# Patient Record
Sex: Female | Born: 1975 | Race: Black or African American | Hispanic: No | Marital: Married | Smoking: Never smoker
Health system: Southern US, Community
[De-identification: ages and names within clinical notes are randomized; demographics above are authoritative.]

## PROBLEM LIST (undated history)

## (undated) ENCOUNTER — Ambulatory Visit: Payer: No Typology Code available for payment source

## (undated) DIAGNOSIS — D649 Anemia, unspecified: Secondary | ICD-10-CM

## (undated) DIAGNOSIS — N809 Endometriosis, unspecified: Secondary | ICD-10-CM

## (undated) DIAGNOSIS — J45909 Unspecified asthma, uncomplicated: Secondary | ICD-10-CM

## (undated) DIAGNOSIS — R112 Nausea with vomiting, unspecified: Secondary | ICD-10-CM

## (undated) DIAGNOSIS — D219 Benign neoplasm of connective and other soft tissue, unspecified: Secondary | ICD-10-CM

## (undated) HISTORY — PX: TUBAL LIGATION: SHX77

## (undated) HISTORY — PX: OTHER SURGICAL HISTORY: SHX169

## (undated) HISTORY — PX: ABDOMINAL HYSTERECTOMY: SHX81

## (undated) MED ORDER — AZITHROMYCIN 250 MG TAB
250 mg | PACK | ORAL | Status: DC
Start: ? — End: 2013-04-12

## (undated) MED ORDER — LEVOFLOXACIN 750 MG TAB
750 mg | ORAL_TABLET | Freq: Every day | ORAL | Status: AC
Start: ? — End: 2012-10-16

## (undated) MED ORDER — BUDESONIDE-FORMOTEROL HFA 160 MCG-4.5 MCG/ACTUATION AEROSOL INHALER: Freq: Two times a day (BID) | RESPIRATORY_TRACT | Status: AC

## (undated) MED ORDER — METFORMIN 500 MG TAB: 500 mg | ORAL_TABLET | Freq: Two times a day (BID) | ORAL | Status: AC

## (undated) MED ORDER — LEVOFLOXACIN 750 MG TAB
750 mg | ORAL_TABLET | Freq: Every day | ORAL | Status: DC
Start: ? — End: 2012-10-09

## (undated) MED ORDER — AZITHROMYCIN 250 MG TAB
250 mg | ORAL_TABLET | ORAL | Status: DC
Start: ? — End: 2013-12-13

## (undated) MED ORDER — IBUPROFEN 800 MG TAB: 800 mg | ORAL_TABLET | Freq: Three times a day (TID) | ORAL | Status: AC | PRN

## (undated) MED ORDER — IRON FUM,PS COMP 125 MG IRON-FOLIC ACID 1 MG-VIT B COMP,C NO.9 CAPSULE
125 mg iron- 1 mg | ORAL_CAPSULE | Freq: Every day | ORAL | Status: DC
Start: ? — End: 2012-12-11

---

## 2012-08-08 LAB — AMB POC COMPLETE CBC,AUTOMATED ENTER
ABS. GRANS (POC): 2.2 10*3/uL (ref 1.4–6.5)
ABS. LYMPHS (POC): 4.3 10*3/uL (ref 1.2–3.4)
ABS. MONOS (POC): 0.5 10*3/uL (ref 0.1–0.6)
GRANULOCYTES (POC): 31.4 % (ref 42.2–75.2)
HCT (POC): 35.3 % (ref 35–60)
HGB (POC): 10.9 g/dL (ref 11–18)
LYMPHOCYTES (POC): 61.7 % (ref 20.5–51.1)
MCH (POC): 26.6 pg (ref 27–31)
MCHC (POC): 31 g/dL (ref 33–37)
MCV (POC): 85.8 fL (ref 80–99.9)
MONOCYTES (POC): 6.9 % (ref 1.7–9.3)
MPV (POC): 8.5 fL (ref 7.8–11)
PLATELET (POC): 275 10*3/uL (ref 150–450)
RBC (POC): 4.11 M/uL (ref 4–6)
RDW (POC): 15.9 % (ref 11.6–13.7)
WBC (POC): 6.9 10*3/uL (ref 4.5–10.5)

## 2012-08-08 LAB — AMB POC URINALYSIS DIP STICK MANUAL W/ MICRO
Bilirubin (UA POC): NEGATIVE
Blood (UA POC): NEGATIVE
Glucose (UA POC): NEGATIVE
Ketones (UA POC): NEGATIVE
Leukocyte esterase (UA POC): NEGATIVE
Nitrites (UA POC): NEGATIVE
Protein (UA POC): NEGATIVE mg/dL
Specific gravity (UA POC): 1.01 (ref 1.001–1.035)
Urobilinogen (UA POC): 0.2 (ref 0.2–1)
pH (UA POC): 6.5 (ref 4.6–8.0)

## 2012-08-08 NOTE — Progress Notes (Signed)
HISTORY OF PRESENT ILLNESS  Brenda James is a 36 y.o. female.  Dizziness   The history is provided by the patient. The current episode started more than 1 week ago. The problem has been gradually worsening. There was no loss of consciousness. Associated symptoms include malaise/fatigue, headaches, dizziness and light-headedness. Pertinent negatives include no visual change, no chest pain, no palpitations, no fever, no abdominal pain, no nausea, no vomiting, no congestion and no back pain. Her past medical history does not include no DM, no HTN, no vertigo, no seizures or no syncope.   This is her first visit to our clinic. Symptoms off and on x 1 year, dizzy, weak, winded, hands shake a lot when she holds them out. Similar to how she felt while pregnant but no nausea. Anemic, takes iron pills but not seeing a difference.   Last labs for anemia 8 months ago, hgb been as low 9, last time was about 10. Has heavy menstrual bleeding - fibroids and endometriosis. Still feels bad even when not on cycle. They last 7 days.   Shakiness is all over, physical exhaustion. Some SOB with exertion, she does exercise 3x per week but has scaled it down. Headache off and on, feels like regular headaches.  Dizziness is not like room spinning.  No syncope.   Night sweats during the summertime, 1-2 x per month. Has had Anemia since birth of her last child in 2006.  Currently on ocp but will be getting implanon next month with OB    Review of Systems   Constitutional: Positive for malaise/fatigue. Negative for fever and chills.   HENT: Negative for ear pain, congestion and sore throat.    Eyes: Negative for blurred vision, pain and redness.   Respiratory: Positive for shortness of breath. Negative for cough.    Cardiovascular: Negative for chest pain and palpitations.   Gastrointestinal: Negative for nausea, vomiting and abdominal pain.   Genitourinary: Negative for dysuria.   Musculoskeletal: Negative for myalgias and back pain.    Skin: Negative for rash.   Neurological: Positive for dizziness, tremors, light-headedness and headaches. Negative for syncope.   Psychiatric/Behavioral: Negative for depression.     BP 130/80   Pulse 77   Temp(Src) 98.2 ??F (36.8 ??C) (Oral)   Ht 5' 9.5" (1.765 m)   Wt 263 lb (119.296 kg)   BMI 38.29 kg/m2     Physical Exam   Nursing note and vitals reviewed.  Constitutional: She is oriented to person, place, and time. She appears well-developed and well-nourished. No distress.   HENT:   Head: Normocephalic and atraumatic.   Neck: No JVD present. No thyromegaly present.   Cardiovascular: Normal rate, regular rhythm, normal heart sounds and intact distal pulses.  Exam reveals no gallop and no friction rub.    No murmur heard.  Pulmonary/Chest: Effort normal and breath sounds normal. She has no wheezes.   Neurological: She is alert and oriented to person, place, and time.   Skin: Skin is warm and dry.       ASSESSMENT and PLAN  1. Fatigue  AMB POC COMPLETE CBC,AUTOMATED ENTER, AMB POC URINALYSIS DIP STICK MANUAL W/ MICRO, METABOLIC PANEL, COMPREHENSIVE, HEMOGLOBIN A1C, TSH, 3RD GENERATION, FERRITIN, IRON    check labs to rule out possible underlying causes. she does have h/o anemia so this could be a cause. Arrange f/u once labs are back.   2. Dizziness  AMB POC COMPLETE CBC,AUTOMATED ENTER, AMB POC URINALYSIS DIP STICK MANUAL W/ MICRO, METABOLIC  PANEL, COMPREHENSIVE, HEMOGLOBIN A1C, TSH, 3RD GENERATION, FERRITIN, IRON

## 2012-08-09 LAB — METABOLIC PANEL, COMPREHENSIVE
A-G Ratio: 1.1 (ref 1.1–2.5)
ALT (SGPT): 14 IU/L (ref 0–32)
AST (SGOT): 20 IU/L (ref 0–40)
Albumin: 3.8 g/dL (ref 3.5–5.5)
Alk. phosphatase: 79 IU/L (ref 39–117)
BUN/Creatinine ratio: 12 (ref 8–20)
BUN: 7 mg/dL (ref 6–20)
Bilirubin, total: 0.2 mg/dL (ref 0.0–1.2)
CO2: 21 mmol/L (ref 19–28)
Calcium: 9 mg/dL (ref 8.7–10.2)
Chloride: 101 mmol/L (ref 97–108)
Creatinine: 0.6 mg/dL (ref 0.57–1.00)
GFR est AA: 135 mL/min/{1.73_m2} (ref >59)
GFR est non-AA: 117 mL/min/{1.73_m2} (ref >59)
GLOBULIN, TOTAL: 3.4 g/dL (ref 1.5–4.5)
Glucose: 85 mg/dL (ref 65–99)
Potassium: 4.4 mmol/L (ref 3.5–5.2)
Protein, total: 7.2 g/dL (ref 6.0–8.5)
Sodium: 136 mmol/L (ref 134–144)

## 2012-08-09 LAB — IRON: Iron: 21 ug/dL — ABNORMAL LOW (ref 35–155)

## 2012-08-09 LAB — HEMOGLOBIN A1C WITH EAG: Hemoglobin A1c: 6 % — ABNORMAL HIGH (ref 4.8–5.6)

## 2012-08-09 LAB — TSH 3RD GENERATION: TSH: 0.543 u[IU]/mL (ref 0.450–4.500)

## 2012-08-09 LAB — FERRITIN: Ferritin: 9 ng/mL — ABNORMAL LOW (ref 15–150)

## 2012-08-09 NOTE — Progress Notes (Signed)
Quick Note:    Her labs do show anemia - Hgb 10.9 but her iron levels are very low. I will send in rx iron supplement and recheck in 3 months. Also her labs did show she is pre-diabetic which could explain some of her symptoms - i would like to see her back to discuss treatment options. Otherwise labs normal.  ______

## 2012-08-09 NOTE — Telephone Encounter (Signed)
Patient notified of information. appt 11/12.

## 2012-08-09 NOTE — Telephone Encounter (Signed)
Message copied by Deatra Ina on Thu Aug 09, 2012  1:54 PM  ------       Message from: Charlsie Merles CASTON       Created: Thu Aug 09, 2012  1:29 PM         Her labs do show anemia - Hgb 10.9 but her iron levels are very low. I will send in rx iron supplement and recheck in 3 months. Also her labs did show she is pre-diabetic which could explain some of her symptoms - i would like to see her back to discuss treatment options. Otherwise labs normal.  ------

## 2012-08-09 NOTE — Telephone Encounter (Signed)
Left message for patient to return our call.

## 2012-08-14 NOTE — Progress Notes (Signed)
HISTORY OF PRESENT ILLNESS  Brenda James is a 36 y.o. female.  HPI  The patient is a 36 y.o. female who is seen for follow up of prediabetes, anemia.  Symptoms are stable.  The patient is following treatment regimen with good compliance.  She  is following an appropriate diet and is exercising regularly.   She had some abnormal labs at her last appt - here to discuss them. She has Been on iron x 3 days and feels a difference already.   She has strong FH of diabetes and is concerned about her pre-diabetes diagnosis. She has been working out at gym 3x a week, not seeing results or losing weight. She doesn't eat a lot of sweets, but does like pasta. Found out her paternal uncle and GF and Dad had diabetes.    Review of Systems   Constitutional: Negative for fever, chills and malaise/fatigue.   HENT: Negative for ear pain, congestion and sore throat.    Eyes: Negative for blurred vision, pain and redness.   Respiratory: Negative for cough and shortness of breath.    Cardiovascular: Negative for chest pain and palpitations.   Gastrointestinal: Negative for nausea, vomiting and abdominal pain.   Genitourinary: Negative for dysuria.   Musculoskeletal: Negative for myalgias and back pain.   Skin: Negative for rash.   Neurological: Negative for dizziness and headaches.   Psychiatric/Behavioral: Negative for depression.     BP 110/90   Pulse 68   Temp(Src) 98.6 ??F (37 ??C) (Oral)   Ht 5' 9.5" (1.765 m)   Wt 263 lb (119.296 kg)   BMI 38.29 kg/m2     Physical Exam   Constitutional: She is oriented to person, place, and time. She appears well-developed and well-nourished. She is cooperative. No distress.   HENT:   Head: Normocephalic and atraumatic.   Pulmonary/Chest: Effort normal. No respiratory distress.   Neurological: She is alert and oriented to person, place, and time. Coordination and gait normal.   Skin: No rash noted. She is not diaphoretic. No pallor.   Psychiatric: She has a normal mood and affect. Her speech is  normal and behavior is normal. Judgment and thought content normal. Her mood appears not anxious. Cognition and memory are normal. She does not exhibit a depressed mood.       ASSESSMENT and PLAN  1. Prediabetes  metFORMIN (GLUCOPHAGE) 500 mg tablet   2. Iron deficiency anemia, unspecified      doing well on iron supplement, recheck CBC and iron levels in 3 months   Went over diagnosis, pathophysiology, and treatment options. Start Metformin and diabetic diet. Handouts provided. RTC 3 months to recheck A1C.  15 minutes spent counseling patient today.

## 2012-08-29 NOTE — Progress Notes (Signed)
HISTORY OF PRESENT ILLNESS  Brenda James is a 36 y.o. female.  HPI  The patient is a 36 y.o. female who is seen for evaluation of productive cough. Symptoms began 2 weeks ago and are gradually worsening since that time.  Treatment to date: decongestants, cough suppressants.  Started as runny nose 2 weeks ago. Now has dry cough, every now and then is moist. Feels rattling and wheezing in chest x 1.5 weeks. Taking OTC meds and not getting better.   Had aches initially, but then that passed. Cough has stayed. Coughs up mucus first thing in the am. Taking theraflu, alka seltzer cold, mucinex. mucinex helped some.   Sore throat went away. Does feel short of breath.      Review of Systems   Constitutional: Negative for fever, chills and malaise/fatigue.   HENT: Positive for sore throat. Negative for ear pain and congestion.    Eyes: Negative for blurred vision, pain and redness.   Respiratory: Positive for cough, sputum production and wheezing. Negative for shortness of breath.    Cardiovascular: Negative for chest pain and palpitations.   Gastrointestinal: Negative for nausea, vomiting and abdominal pain.   Genitourinary: Negative for dysuria.   Musculoskeletal: Negative for myalgias and back pain.   Skin: Negative for rash.   Neurological: Positive for headaches. Negative for dizziness.   Psychiatric/Behavioral: Negative for depression.     BP 108/90   Pulse 79   Temp(Src) 98.4 ??F (36.9 ??C) (Oral)   Ht 5' 9.5" (1.765 m)   Wt 255 lb (115.667 kg)   BMI 37.13 kg/m2     Physical Exam   Nursing note and vitals reviewed.  Constitutional: She appears well-developed and well-nourished. No distress.   HENT:   Head: Normocephalic and atraumatic.   Right Ear: Hearing, tympanic membrane, external ear and ear canal normal.   Left Ear: Hearing, tympanic membrane, external ear and ear canal normal.   Nose: Mucosal edema and rhinorrhea present. No sinus tenderness.   Mouth/Throat: Posterior oropharyngeal erythema present.    Cardiovascular: Normal rate, regular rhythm and normal heart sounds.    Pulmonary/Chest: Effort normal and breath sounds normal. No respiratory distress. She has no wheezes. She has no rhonchi. She has no rales.   Lymphadenopathy:     She has cervical adenopathy.       ASSESSMENT and PLAN  1. Acute bronchitis  azithromycin (ZITHROMAX Z-PAK) 250 mg tablet   Treat with antibiotics, RTC if not improving

## 2012-09-10 NOTE — Telephone Encounter (Signed)
Patient states she is still having some coughing and wheezing. Should she just wait a few days to let the antibiotic work or RTC?

## 2012-09-10 NOTE — Telephone Encounter (Signed)
i sent rx for short course of prednisone to pharmacy - sounds like she is still having airway spasm. This should help with that. RTC if not improved after that

## 2012-09-10 NOTE — Telephone Encounter (Signed)
Patient notified of information.

## 2012-10-09 NOTE — Progress Notes (Signed)
HISTORY OF PRESENT ILLNESS  Brenda James is a 37 y.o. female.  HPI  HISTORY OF PRESENT ILLNESS  Brenda James is a 37 y.o. female.  HPI  The patient is a 37 y.o. female who is seen for evaluation of productive cough. Symptoms began 2 weeks ago and are gradually worsening since that time.  Treatment to date: decongestants, cough suppressants.  Started as runny nose 2 weeks ago. Now has dry cough, every now and then is moist. Feels rattling and wheezing in chest x 1.5 weeks. Taking OTC meds and not getting better.   Had aches initially, but then that passed. Cough has stayed. Coughs up mucus first thing in the am. Taking theraflu, alka seltzer cold, mucinex. mucinex helped some.   Sore throat went away. Does feel short of breath.  UPDATE 10/09/12: Same as last time but to a lesser degree. Still coughing and having some spasms. Dry hacking cough. Got better on antibiotics but cough was still there, nonproductive. Just started back up within last 2 weeks and getting worse again. While on elliptical the other day while exercising she felt SOB which has hever happened before. Symptoms worse at night. prednisone helped a little bit but not too much. No fevers and feels OK otherwise. Did have bad episode of bronchitis last year, had to use inhaler.    Review of Systems   Constitutional: Negative for fever, chills and malaise/fatigue.   HENT: Negative for ear pain and congestion, sore throat  Eyes: Negative for blurred vision, pain and redness.   Respiratory: Positive for cough, sputum production and wheezing. Negative for shortness of breath.    Cardiovascular: Negative for chest pain and palpitations.   Gastrointestinal: Negative for nausea, vomiting and abdominal pain.   Genitourinary: Negative for dysuria.   Musculoskeletal: Negative for myalgias and back pain.   Skin: Negative for rash.   Neurological: Positive for headaches. Negative for dizziness.   Psychiatric/Behavioral: Negative for depression.     BP 122/72    Pulse 89   Temp(Src) 98.2 ??F (36.8 ??C) (Oral)   Ht 5' 9.5" (1.765 m)   Wt 258 lb (117.028 kg)   BMI 37.57 kg/m2     Physical Exam   Nursing note and vitals reviewed.  Constitutional: She appears well-developed and well-nourished. No distress.   HENT:   Head: Normocephalic and atraumatic.   Right Ear: Hearing, tympanic membrane, external ear and ear canal normal.   Left Ear: Hearing, tympanic membrane, external ear and ear canal normal.   Nose: Mucosal edema and rhinorrhea present. No sinus tenderness.   Mouth/Throat: Posterior oropharyngeal erythema present.   Cardiovascular: Normal rate, regular rhythm and normal heart sounds.    Pulmonary/Chest: Effort normal and breath sounds normal. No respiratory distress. She has no wheezes. She has no rhonchi. She has no rales.   Lymphadenopathy:     She has cervical adenopathy.       ROS    Physical Exam    ASSESSMENT and PLAN  1. Acute bronchitis  levofloxacin (LEVAQUIN) 750 mg tablet, budesonide-formoterol (SYMBICORT) 160-4.5 mcg/actuation HFA inhaler, DISCONTINUED: levofloxacin (LEVAQUIN) 750 mg tablet    not resolving, tx with levaquin. add symbicort inhaler BID for 1-2 weeks. RTC if not resolving and will do cxr and labs

## 2012-10-31 NOTE — Progress Notes (Signed)
HISTORY OF PRESENT ILLNESS  Brenda James is a 37 y.o. female.  Numbness  The history is provided by the patient. This is a new problem. The current episode started more than 1 week ago. The problem has not changed since onset.There was right lower extremity focality noted. Primary symptoms include loss of sensation.Pertinent negatives include no loss of balance. There has been no fever. Pertinent negatives include no shortness of breath, no chest pain, no vomiting, no headaches and no nausea. Associated medical issues include trauma.   Right pinky toe numb x 3.5 weeks and under the ball of the foot on that side. Constant. Is on her feet most of the day at new job but is wearing comfortable shoes (tennis shoes). Nothing in the other foot. At first felt like foot was asleep but now just feels numb. Can't feel pain (has tested it with pin prick at home). She is on metformin x 2-3 months now. Worries it could be from the prediabetes. No trauma ot the foot. Does not run.    Review of Systems   Constitutional: Negative for fever, chills and malaise/fatigue.   HENT: Negative for ear pain, congestion and sore throat.    Eyes: Negative for blurred vision, pain and redness.   Respiratory: Negative for cough and shortness of breath.    Cardiovascular: Negative for chest pain and palpitations.   Gastrointestinal: Negative for nausea, vomiting and abdominal pain.   Genitourinary: Negative for dysuria.   Musculoskeletal: Negative for myalgias and back pain.   Skin: Negative for rash.   Neurological: Positive for tingling and numbness. Negative for dizziness, headaches and loss of balance.   Psychiatric/Behavioral: Negative for depression.     BP 110/70   Pulse 98   Temp(Src) 98.4 ??F (36.9 ??C) (Oral)   Ht 5' 9.5" (1.765 m)   Wt 256 lb (116.121 kg)   BMI 37.28 kg/m2     Physical Exam   Constitutional: She is oriented to person, place, and time. She appears well-developed and well-nourished. She is cooperative. No distress.    HENT:   Head: Normocephalic and atraumatic.   Pulmonary/Chest: Effort normal. No respiratory distress.   Musculoskeletal:        Right ankle: She exhibits no swelling, no ecchymosis, no deformity and normal pulse. No tenderness.   Neurological: She is alert and oriented to person, place, and time. She has normal strength. A sensory deficit is present. No cranial nerve deficit. Coordination and gait normal.   Decreased sensation to light touch, pressure and pain over right 5th toe and lateral edge of foot from toes to arch (plantar surface)   Skin: No rash noted. She is not diaphoretic. No pallor.   Psychiatric: She has a normal mood and affect. Her speech is normal and behavior is normal. Judgment and thought content normal. Her mood appears not anxious. Cognition and memory are normal. She does not exhibit a depressed mood.       ASSESSMENT and PLAN  1. Numbness     if still present in 3-4 weeks call back and will send to neurology for nerve conduction studies   no abnormality seen on exam today - differential includes superficial nerve compression from new shoewear/being on feet all day, peripheral neuropathy, or back/nerve issue. Call back sooner if worsening. Advised her to try switching to different pair of shoes, rest/elevation of foot when not working.

## 2012-12-11 LAB — AMB POC COMPLETE CBC,AUTOMATED ENTER
ABS. GRANS (POC): 2.7 10*3/uL (ref 1.4–6.5)
ABS. LYMPHS (POC): 2.9 10*3/uL (ref 1.2–3.4)
ABS. MONOS (POC): 0.4 10*3/uL (ref 0.1–0.6)
GRANULOCYTES (POC): 45 % (ref 42.2–75.2)
HCT (POC): 37.4 % (ref 35–60)
HGB (POC): 12.3 g/dL (ref 11–18)
LYMPHOCYTES (POC): 48.8 % (ref 20.5–51.1)
MCH (POC): 28.8 pg (ref 27–31)
MCHC (POC): 32.9 g/dL — AB (ref 33–37)
MCV (POC): 87.6 fL (ref 80–99.9)
MONOCYTES (POC): 6.2 % (ref 1.7–9.3)
MPV (POC): 7.8 fL (ref 7.8–11)
PLATELET (POC): 258 10*3/uL (ref 150–450)
RBC (POC): 4.27 M/uL (ref 4–6)
RDW (POC): 14.2 % — AB (ref 11.6–13.7)
WBC (POC): 5.9 10*3/uL (ref 4.5–10.5)

## 2012-12-11 NOTE — Progress Notes (Signed)
HISTORY OF PRESENT ILLNESS  Brenda James is a 37 y.o. female.  HPI  The patient is a 37 y.o. female who is seen for follow up of anemia, prediabetes.  Symptoms are stable.  The patient is following treatment regimen with good compliance.  She  is following an appropriate diet and is exercising regularly.   Had some Diarrhea from time to time but otherwise doing well on metformin.  Tried to give blood and they told her it was too low (was 1 month ago). She says has been taking iron supplement daily but the nexplanon just kicked in - had period from December until 2 weeks ago. She thinks this might be why level hadn't improved yet.    Review of Systems   Constitutional: Negative for fever, chills and malaise/fatigue.   HENT: Negative for ear pain, congestion and sore throat.    Eyes: Negative for blurred vision, pain and redness.   Respiratory: Negative for cough and shortness of breath.    Cardiovascular: Negative for chest pain and palpitations.   Gastrointestinal: Negative for nausea, vomiting and abdominal pain.   Genitourinary: Negative for dysuria.   Musculoskeletal: Negative for myalgias and back pain.   Skin: Negative for rash.   Neurological: Negative for dizziness and headaches.   Psychiatric/Behavioral: Negative for depression.     BP 110/76   Pulse 100   Temp(Src) 98.4 ??F (36.9 ??C) (Oral)   Ht 5' 9.5" (1.765 m)   Wt 255 lb (115.667 kg)   BMI 37.13 kg/m2     Physical Exam   Constitutional: She is oriented to person, place, and time. She appears well-developed and well-nourished. She is cooperative. No distress.   HENT:   Head: Normocephalic and atraumatic.   Pulmonary/Chest: Effort normal. No respiratory distress.   Neurological: She is alert and oriented to person, place, and time. Coordination and gait normal.   Skin: No rash noted. She is not diaphoretic. No pallor.   Psychiatric: She has a normal mood and affect. Her speech is normal and behavior is normal. Judgment and thought content normal. Her  mood appears not anxious. Cognition and memory are normal. She does not exhibit a depressed mood.       ASSESSMENT and PLAN  1. Anemia  AMB POC COMPLETE CBC,AUTOMATED ENTER, Iron Fum & P-FA-Vit B & C No.9 (INTEGRA PLUS) 125-1 mg cap    still taking iron supplements, recheck CBC. if not improving is likely due to uncontrolled menorrhagia which just stopped with nexplanon.    2. Prediabetes  HEMOGLOBIN A1C, metFORMIN (GLUCOPHAGE) 500 mg tablet    recheck A1C after starting metformin, doing well on it.   15 minutes spent counseling patient today.

## 2012-12-12 LAB — HEMOGLOBIN A1C WITH EAG: Hemoglobin A1c: 5.4 % (ref 4.8–5.6)

## 2012-12-13 NOTE — Telephone Encounter (Signed)
Message copied by Deatra Ina on Thu Dec 13, 2012 12:02 PM  ------       Message from: Charlsie Merles CASTON       Created: Thu Dec 13, 2012  6:25 AM         Her A1C went down from 6.0 to 5.4 which is great! This is now in the normal range - cont the metformin and recheck in 6 mos. Also her Hgb has improved from 10.9 to 12.3 which is normal - hopefully now that menstrual cycle is controlled this will stay stable.  ------

## 2012-12-13 NOTE — Telephone Encounter (Signed)
Left message for patient to return our call.

## 2012-12-18 NOTE — Telephone Encounter (Signed)
Left message for patient to return our call.

## 2012-12-18 NOTE — Telephone Encounter (Signed)
Patient notified of information.

## 2013-01-01 NOTE — Progress Notes (Signed)
HISTORY OF PRESENT ILLNESS  Brenda James is a 37 y.o. female.  HPI c/o severe pain right 5th toe and distal foot s/p fall on stairs last pm. + swelling.    Review of Systems   Musculoskeletal: Positive for joint pain (and swelling).       Physical Exam   Constitutional: She appears well-developed. No distress.   Musculoskeletal:   Right 5th toe swollen; decreased ROM; TTP  Distal 5th MT and MTP joint TTP   Neurological: A sensory deficit (5th toe decreased FT) is present.   Skin: Skin is intact.   Blood pressure 120/74, pulse 86, temperature 98.5 ??F (36.9 ??C), temperature source Oral, height 5' 9.5" (1.765 m), weight 254 lb (115.214 kg), last menstrual period 12/28/2012.      ASSESSMENT and PLAN    ICD-9-CM    1. Toe sprain 845.10 ibuprofen (MOTRIN) 800 mg tablet    fx shoe prn; RICE   2. Foot pain 729.5 XR FOOT RT MIN 3 V     XR negative  F/u prn

## 2013-01-01 NOTE — Patient Instructions (Signed)
Rest, cold compresses, compression wrap and elevation of involved joint recommended.

## 2013-03-01 NOTE — Progress Notes (Signed)
HISTORY OF PRESENT ILLNESS  Brenda James is a 37 y.o. female.  HPI c/o 4 week h/o NP cough w/ occasional paroxysms. Constant day and night. No relief w/ OTC cough meds. Does have mild postnasal drip.    Review of Systems   Constitutional: Negative for fever.   HENT: Negative for congestion.    Respiratory: Positive for cough. Negative for shortness of breath.    Gastrointestinal: Negative for heartburn (oral regurgitation ).       Physical Exam   Constitutional: She appears well-developed. No distress.   HENT:   Nose: Nose normal.   Mouth/Throat: Oropharynx is clear and moist.   Cardiovascular: Normal rate, regular rhythm and normal heart sounds.    No murmur heard.  Pulmonary/Chest: Effort normal and breath sounds normal.   Psychiatric: She has a normal mood and affect.   Blood pressure 110/78, pulse 70, height 5' 9.5" (1.765 m), weight 260 lb (117.935 kg), SpO2 98.00%.      ASSESSMENT and PLAN    ICD-9-CM    1. Cough 786.2 budesonide-formoterol (SYMBICORT) 160-4.5 mcg/actuation HFA inhaler     azithromycin (ZITHROMAX Z-PAK) 250 mg tablet     benzonatate (TESSALON) 200 mg capsule       Allergic PND vs pertussis - will tx as above + zyrtec 10 mg qd

## 2013-04-12 NOTE — Progress Notes (Signed)
HISTORY OF PRESENT ILLNESS  Brenda James is a 37 y.o. female. She hurt her right ankle. Last night she was walking on wet grass and was wearing flip flops. She fell  With her right leg bent behind her with her right foot everted.She heard a  loud snap. Can not bear weight. Has to limp. Husband had to help her home last night. Today it is swollen and very painful.    HPI    Review of Systems   Constitutional: Negative for fever, chills, weight loss and malaise/fatigue.   HENT: Negative for hearing loss.    Cardiovascular: Positive for palpitations. Negative for chest pain.   Gastrointestinal: Negative for nausea and vomiting.   Genitourinary: Negative for dysuria.   Musculoskeletal: Positive for joint pain (right ankle).   Neurological: Negative for dizziness, tingling and headaches.       Physical Exam   Nursing note and vitals reviewed.  Constitutional: She is oriented to person, place, and time. She appears well-developed and well-nourished.   HENT:   Head: Normocephalic.   Cardiovascular: Normal rate and regular rhythm.    Pulmonary/Chest: Effort normal and breath sounds normal.   Musculoskeletal: She exhibits edema (right lateral ankle tenderness strenght intact and no limitation in range of mottion) and tenderness.   Neurological: She is alert and oriented to person, place, and time.   Skin: Skin is warm and dry.   Psychiatric: She has a normal mood and affect. Her behavior is normal.   Sitting in wheelchair    ASSESSMENT and PLAN    ICD-9-CM    1. Ankle pain, right 719.47 XR ANKLE RT MIN 3 V     PR LT COMPRES BAND >=3 <5/YD     ANKLE CONTROL ORTHOSI PREFAB     CRUTCH UNDERARM PAIR WOOD   No fracture. Ice elevated crutches ace wrap and air cast. Follow up on tuesday of next week. Ankle strengthening  exercises when not weight bearing . Crutches for 3 days only. COntinue ibuprofen for pain.

## 2013-08-20 NOTE — Progress Notes (Signed)
HISTORY OF PRESENT ILLNESS  Brenda James is a 37 y.o. female.  HPI  The patient is a 37 y.o. female who is seen for evaluation of productive cough and sore throat. Symptoms began 5 days ago and are gradually worsening since that time.  Treatment to date: none.  Started Friday in chest  - now with some sputum. Now having sore throat, really bad w/ cough. No fevers/chills. Slight body aches. Slight nasal congestion. Worse. No headaches.   Has gotten bronchitis a few times a year in the past.     Review of Systems   Constitutional: Negative for fever, chills and malaise/fatigue.   HENT: Positive for sore throat. Negative for ear pain and congestion.    Eyes: Negative for blurred vision, pain and redness.   Respiratory: Positive for cough, sputum production and wheezing. Negative for shortness of breath.    Cardiovascular: Negative for chest pain and palpitations.   Gastrointestinal: Negative for nausea, vomiting and abdominal pain.   Genitourinary: Negative for dysuria.   Musculoskeletal: Negative for myalgias and back pain.   Skin: Negative for rash.   Neurological: Negative for dizziness and headaches.   Psychiatric/Behavioral: Negative for depression.     BP 130/90   Pulse 116   Temp(Src) 98.9 ??F (37.2 ??C) (Oral)   Ht 5' 9.5" (1.765 m)   Wt 260 lb (117.935 kg)   BMI 37.86 kg/m2   SpO2 97%     Physical Exam   Nursing note and vitals reviewed.  Constitutional: She appears well-developed and well-nourished. No distress.   HENT:   Head: Normocephalic and atraumatic.   Right Ear: Hearing, tympanic membrane, external ear and ear canal normal.   Left Ear: Hearing, tympanic membrane, external ear and ear canal normal.   Nose: Mucosal edema and rhinorrhea present. No sinus tenderness.   Mouth/Throat: Posterior oropharyngeal erythema present.   Cardiovascular: Normal rate, regular rhythm and normal heart sounds.    Pulmonary/Chest: Effort normal and breath sounds normal. No respiratory distress. She has no wheezes. She has  no rhonchi. She has no rales.   Tight wheezy cough   Lymphadenopathy:     She has cervical adenopathy.       ASSESSMENT and PLAN    ICD-9-CM    1. Acute bronchitis 466.0 azithromycin (ZITHROMAX Z-PAK) 250 mg tablet     budesonide-formoterol (SYMBICORT) 160-4.5 mcg/actuation HFA inhaler    tx with zpack and symbicort for acute use. Discussed she may have recurrent bronchitis due to asthma.   2. Flu vaccine need V04.81 INFLUENZA VACCINE QUADRIVALENT 3 YRS PLUS IM     PR IMMUNIZ ADMIN,1 SINGLE/COMB VAC/TOXOID

## 2013-12-13 LAB — AMB POC URINALYSIS DIP STICK MANUAL W/ MICRO
Bilirubin (UA POC): NEGATIVE
Glucose (UA POC): NEGATIVE
Ketones (UA POC): NEGATIVE
Leukocyte esterase (UA POC): NEGATIVE
Nitrites (UA POC): NEGATIVE
Protein (UA POC): NEGATIVE mg/dL
Specific gravity (UA POC): 1.02 (ref 1.001–1.035)
Urobilinogen (UA POC): 0.2 (ref 0.2–1)
pH (UA POC): 7 (ref 4.6–8.0)

## 2013-12-13 MED ORDER — CIPROFLOXACIN 500 MG TAB
500 mg | ORAL_TABLET | Freq: Two times a day (BID) | ORAL | Status: DC
Start: 2013-12-13 — End: 2014-02-06

## 2013-12-13 NOTE — Progress Notes (Signed)
HISTORY OF PRESENT ILLNESS  Brenda James is a 38 y.o. female.  HPI   The patient is a 38 y.o. female who is seen for evaluation of frequency.  She has had symptoms for 2 weeks. Patient also complains of see below. Patient denies fever. Patient does not have a history of recurrent UTI.  Patient does not have a history of pyelonephritis.   Urgency, cloudy, worse in morning, strong odor. Been on for a few weeks. Getting worse. Just started nuvaring 3 mos ago. No back pain/fever/chills. Denies any vaginal discharge or discomfort      Review of Systems   Constitutional: Negative for fever, chills and malaise/fatigue.   HENT: Negative for congestion, ear pain and sore throat.    Eyes: Negative for blurred vision, pain and redness.   Respiratory: Negative for cough and shortness of breath.    Cardiovascular: Negative for chest pain and palpitations.   Gastrointestinal: Negative for nausea, vomiting and abdominal pain.   Genitourinary: Positive for frequency. Negative for dysuria.   Musculoskeletal: Negative for myalgias and back pain.   Skin: Negative for rash.   Neurological: Negative for dizziness and headaches.   Psychiatric/Behavioral: Negative for depression.     BP 122/78    Pulse 117    Temp(Src) 98.4 ??F (36.9 ??C) (Oral)    Ht 5' 9.5" (1.765 m)    Wt 272 lb (123.378 kg)    BMI 39.60 kg/m2      SpO2 97%      Physical Exam   Constitutional: She is oriented to person, place, and time. She appears well-developed and well-nourished. No distress.   HENT:   Head: Normocephalic and atraumatic.   Neck: Neck supple.   Cardiovascular: Normal rate, regular rhythm and normal heart sounds.  Exam reveals no gallop and no friction rub.    No murmur heard.  Pulmonary/Chest: Effort normal and breath sounds normal.   Abdominal: Soft. Bowel sounds are normal. There is no hepatosplenomegaly. There is tenderness in the suprapubic area. No hernia.   Neurological: She is alert and oriented to person, place, and time.   Skin: Skin is  warm and dry.   Nursing note and vitals reviewed.      ASSESSMENT and PLAN    ICD-9-CM    1. UTI (lower urinary tract infection) 599.0     tx with cipro, RTC if not improving   2. Urinary hesitancy 788.64 AMB POC URINALYSIS DIP STICK MANUAL W/ MICRO     ciprofloxacin HCl (CIPRO) 500 mg tablet

## 2014-02-06 MED ORDER — VALACYCLOVIR 1 G TAB
1 gram | ORAL_TABLET | Freq: Two times a day (BID) | ORAL | Status: AC
Start: 2014-02-06 — End: 2014-02-07

## 2014-02-06 MED ORDER — AZITHROMYCIN 250 MG TAB
250 mg | ORAL_TABLET | ORAL | Status: AC
Start: 2014-02-06 — End: ?

## 2014-02-06 MED ORDER — ALBUTEROL SULFATE HFA 90 MCG/ACTUATION AEROSOL INHALER
90 mcg/actuation | RESPIRATORY_TRACT | Status: AC | PRN
Start: 2014-02-06 — End: ?

## 2014-02-06 NOTE — Progress Notes (Signed)
HISTORY OF PRESENT ILLNESS  Brenda James is a 38 y.o. female.  HPI   The patient is a 38 y.o. female who is seen for evaluation of nonproductive cough and wheezing. Symptoms began 2 weeks ago and are gradually worsening since that time.  Treatment to date: decongestants, cough suppressants.  2-3 weeks ago started w/ bad breakout of hives. Then got cold sx, sore throat, nasal congestion, wheezy, coughing some sputum initially. Sore throat is better but still has cough, wheezing, congestion in chest. No fevers, some slight body aches. Tried theraflu, mucinex - helped some.   Still has symbicort - started using it again when these symptoms started - takes it at night b/c that is when she has more trouble w/ breathing. Brother has asthma. Pt never been diagnosed w/ asthma but we have seen her a few times for recurrent bronchitis.   Says she hasn't had any allergy symptoms, doesn't usually have issues w/ that.    Would like something for cold sores prn - usually gets it 5-6 times per year. More in past 3 weeks.    Review of Systems   Constitutional: Positive for malaise/fatigue. Negative for fever and chills.   HENT: Positive for sore throat. Negative for congestion and ear pain.    Eyes: Negative for blurred vision, pain and redness.   Respiratory: Positive for cough and wheezing. Negative for shortness of breath.    Cardiovascular: Negative for chest pain and palpitations.   Gastrointestinal: Negative for nausea, vomiting and abdominal pain.   Genitourinary: Negative for dysuria.   Musculoskeletal: Positive for myalgias. Negative for back pain.   Skin: Negative for rash.   Neurological: Negative for dizziness and headaches.   Psychiatric/Behavioral: Negative for depression.     BP 134/79   Pulse 103   Temp(Src) 98.1 ??F (36.7 ??C) (Oral)   Ht 5' 9.5" (1.765 m)   Wt 283 lb (128.368 kg)   BMI 41.21 kg/m2   SpO2 99%     Physical Exam   Constitutional: She appears well-developed and well-nourished. No distress.   HENT:    Head: Normocephalic and atraumatic.   Right Ear: Hearing, tympanic membrane, external ear and ear canal normal.   Left Ear: Hearing, tympanic membrane, external ear and ear canal normal.   Nose: Mucosal edema and rhinorrhea present. No sinus tenderness.   Mouth/Throat: Posterior oropharyngeal erythema present.   Cardiovascular: Normal rate, regular rhythm and normal heart sounds.    Pulmonary/Chest: Effort normal and breath sounds normal. No respiratory distress. She has no wheezes. She has no rhonchi. She has no rales.   Lymphadenopathy:     She has cervical adenopathy.   Nursing note and vitals reviewed.      ASSESSMENT and PLAN    ICD-9-CM    1. Bronchitis 490 azithromycin (ZITHROMAX Z-PAK) 250 mg tablet     albuterol (PROVENTIL HFA, VENTOLIN HFA, PROAIR HFA) 90 mcg/actuation inhaler    tx with zpack, cont symbicort daily until infection resolves. prn albuterol inhaler to use for SOB or cough/wheeze. RTC 1 mos to do PFTs as may have asthma   2. Cold sore 054.9 valACYclovir (VALTREX) 1 gram tablet    valtrex to use prn cold sores

## 2014-08-20 ENCOUNTER — Other Ambulatory Visit (HOSPITAL_COMMUNITY)
Admission: RE | Admit: 2014-08-20 | Discharge: 2014-08-20 | Disposition: A | Discharge status: Home / Self Care | Payer: Self-pay | Source: Ambulatory Visit | Attending: Obstetrics and Gynecology | Admitting: Obstetrics and Gynecology

## 2014-08-20 DIAGNOSIS — Z1151 Encounter for screening for human papillomavirus (HPV): Secondary | ICD-10-CM | POA: Insufficient documentation

## 2014-08-20 DIAGNOSIS — Z01419 Encounter for gynecological examination (general) (routine) without abnormal findings: Secondary | ICD-10-CM | POA: Insufficient documentation

## 2014-09-04 ENCOUNTER — Encounter: Payer: Self-pay | Admitting: Internal Medicine

## 2014-09-15 ENCOUNTER — Ambulatory Visit (INDEPENDENT_AMBULATORY_CARE_PROVIDER_SITE_OTHER): Payer: BC Managed Care – PPO | Admitting: Endocrinology

## 2014-09-15 ENCOUNTER — Encounter: Payer: Self-pay | Admitting: Endocrinology

## 2014-09-15 VITALS — BP 126/78 | HR 99 | Temp 98.9°F | Ht 69.5 in | Wt 294.0 lb

## 2014-09-15 DIAGNOSIS — R251 Tremor, unspecified: Secondary | ICD-10-CM

## 2014-09-15 DIAGNOSIS — E079 Disorder of thyroid, unspecified: Secondary | ICD-10-CM | POA: Insufficient documentation

## 2014-09-15 LAB — TSH: TSH: 0.68 u[IU]/mL (ref 0.35–4.50)

## 2014-09-15 LAB — T4, FREE: FREE T4: 0.97 ng/dL (ref 0.60–1.60)

## 2014-09-15 NOTE — Progress Notes (Signed)
Subjective:    Patient ID: Virginia Olson, female    DOB: 03-24-1976, 38 y.o.   MRN: [id number]  HPI In late 2015, pt was noted to have slight fullness at the anterior neck, and assoc fatigue.  She has never had thyroid probs.  she has never been on therapy for this.  she has never had XRT to the anterior neck, or thyroid surgery.  she has never had thyroid imaging.  she does not consume kelp or any other prescribed or non-prescribed thyroid medication.  she has never been on amiodarone.  No past medical history on file.  No past surgical history on file.  History   Social History  . Marital Status: Unknown    Spouse Name: N/A    Number of Children: N/A  . Years of Education: N/A   Occupational History  . Not on file.   Social History Main Topics  . Smoking status: Never Smoker   . Smokeless tobacco: Not on file  . Alcohol Use: 0.0 oz/week    0 Not specified per week  . Drug Use: Not on file  . Sexual Activity: Not on file   Other Topics Concern  . Not on file   Social History Narrative  . No narrative on file    No current outpatient prescriptions on file prior to visit.   No current facility-administered medications on file prior to visit.    Allergies not on file  Family History  Problem Relation Age of Onset  . Thyroid disease Neg Hx     BP 126/78 mmHg  Pulse 99  Temp(Src) 98.9 F (37.2 C) (Oral)  Ht 5' 9.5" (1.765 m)  Wt 294 lb (133.358 kg)  BMI 42.81 kg/m2  SpO2 97%  Review of Systems denies headache, hoarseness, double vision, palpitations, sob, diarrhea, polyuria, myalgias, excessive diaphoresis, anxiety, easy bruising, and rhinorrhea.  She has slight chronic tremor of the hands, and weight gain.  She has infrequent menses (due to being on nuva-ring).      Objective:   Physical Exam VS: see vs page GEN: no distress HEAD: head: no deformity eyes: no periorbital swelling, no proptosis external nose and ears are normal mouth: no lesion  seen NECK: supple, thyroid is not enlarged CHEST WALL: no deformity LUNGS:  Clear to auscultation CV: reg rate and rhythm, no murmur ABD: abdomen is soft, nontender.  no hepatosplenomegaly.  not distended.  no hernia MUSCULOSKELETAL: muscle bulk and strength are grossly normal.  no obvious joint swelling.  gait is normal and steady.   EXTEMITIES: no deformity. no edema.  PULSES: dorsalis pedis intact bilat.   NEURO:  cn 2-12 grossly intact.   readily moves all 4's.  sensation is intact to touch on the feet.  No tremor SKIN:  Normal texture and temperature.  No rash or suspicious lesion is visible.  Not diaphoretic.  NODES:  None palpable at the neck PSYCH: alert, well-oriented.  Does not appear anxious nor depressed.   i have reviewed the following old records: Office note: pt was eval for above sxs, and eval here  Lab Results  Component Value Date   TSH 0.68 09/15/2014      Assessment & Plan:  Abnormal TFT, possibly due to estradiol, resolved.   Tremor: new.  Not thyroid-related.  Patient is advised the following: Patient Instructions  blood tests are being requested for you today.  We'll let you know about the results. If it is normal, i would be happy to refer  you to a tremor specialist.   addendum; i have ref pt to a tremor specialist, at pt's request.

## 2014-09-15 NOTE — Patient Instructions (Addendum)
blood tests are being requested for you today.  We'll let you know about the results. If it is normal, i would be happy to refer you to a tremor specialist.

## 2014-09-16 DIAGNOSIS — R251 Tremor, unspecified: Secondary | ICD-10-CM | POA: Insufficient documentation

## 2014-12-05 ENCOUNTER — Emergency Department (HOSPITAL_COMMUNITY): Payer: 59

## 2014-12-05 ENCOUNTER — Encounter (HOSPITAL_COMMUNITY): Payer: Self-pay

## 2014-12-05 ENCOUNTER — Emergency Department (HOSPITAL_COMMUNITY)
Admission: EM | Admit: 2014-12-05 | Discharge: 2014-12-05 | Disposition: A | Discharge status: Home / Self Care | Payer: 59 | Attending: Emergency Medicine | Admitting: Emergency Medicine

## 2014-12-05 DIAGNOSIS — Z79899 Other long term (current) drug therapy: Secondary | ICD-10-CM | POA: Diagnosis not present

## 2014-12-05 DIAGNOSIS — Z8742 Personal history of other diseases of the female genital tract: Secondary | ICD-10-CM | POA: Diagnosis not present

## 2014-12-05 DIAGNOSIS — K529 Noninfective gastroenteritis and colitis, unspecified: Secondary | ICD-10-CM | POA: Diagnosis not present

## 2014-12-05 DIAGNOSIS — Z3202 Encounter for pregnancy test, result negative: Secondary | ICD-10-CM | POA: Diagnosis not present

## 2014-12-05 DIAGNOSIS — D649 Anemia, unspecified: Secondary | ICD-10-CM | POA: Insufficient documentation

## 2014-12-05 DIAGNOSIS — R1031 Right lower quadrant pain: Secondary | ICD-10-CM | POA: Diagnosis present

## 2014-12-05 HISTORY — DX: Endometriosis, unspecified: N80.9

## 2014-12-05 HISTORY — DX: Anemia, unspecified: D64.9

## 2014-12-05 LAB — COMPREHENSIVE METABOLIC PANEL
ALT: 15 U/L (ref 0–35)
AST: 18 U/L (ref 0–37)
Albumin: 3.6 g/dL (ref 3.5–5.2)
Alkaline Phosphatase: 97 U/L (ref 39–117)
Anion gap: 4 — ABNORMAL LOW (ref 5–15)
BUN: 6 mg/dL (ref 6–23)
CO2: 25 mmol/L (ref 19–32)
Calcium: 8.8 mg/dL (ref 8.4–10.5)
Chloride: 104 mmol/L (ref 96–112)
Creatinine, Ser: 0.75 mg/dL (ref 0.50–1.10)
GFR calc Af Amer: >90 mL/min (ref >90)
Glucose, Bld: 104 mg/dL — ABNORMAL HIGH (ref 70–99)
POTASSIUM: 3.6 mmol/L (ref 3.5–5.1)
SODIUM: 133 mmol/L — AB (ref 135–145)
Total Bilirubin: 0.8 mg/dL (ref 0.3–1.2)
Total Protein: 7.4 g/dL (ref 6.0–8.3)

## 2014-12-05 LAB — URINALYSIS, ROUTINE W REFLEX MICROSCOPIC
GLUCOSE, UA: NEGATIVE mg/dL
KETONES UR: NEGATIVE mg/dL
Leukocytes, UA: NEGATIVE
NITRITE: NEGATIVE
PH: 5.5 (ref 5.0–8.0)
Protein, ur: 100 mg/dL — AB
SPECIFIC GRAVITY, URINE: 1.03 (ref 1.005–1.030)
Urobilinogen, UA: 1 mg/dL (ref 0.0–1.0)

## 2014-12-05 LAB — CBC WITH DIFFERENTIAL/PLATELET
BASOS ABS: 0 10*3/uL (ref 0.0–0.1)
Basophils Relative: 0 % (ref 0–1)
Eosinophils Absolute: 0.1 10*3/uL (ref 0.0–0.7)
Eosinophils Relative: 0 % (ref 0–5)
HCT: 33.9 % — ABNORMAL LOW (ref 36.0–46.0)
Hemoglobin: 11.1 g/dL — ABNORMAL LOW (ref 12.0–15.0)
LYMPHS ABS: 1.4 10*3/uL (ref 0.7–4.0)
LYMPHS PCT: 10 % — AB (ref 12–46)
MCH: 28.2 pg (ref 26.0–34.0)
MCHC: 32.7 g/dL (ref 30.0–36.0)
MCV: 86.3 fL (ref 78.0–100.0)
Monocytes Absolute: 0.9 10*3/uL (ref 0.1–1.0)
Monocytes Relative: 6 % (ref 3–12)
NEUTROS ABS: 11.8 10*3/uL — AB (ref 1.7–7.7)
Neutrophils Relative %: 84 % — ABNORMAL HIGH (ref 43–77)
Platelets: 290 10*3/uL (ref 150–400)
RBC: 3.93 MIL/uL (ref 3.87–5.11)
RDW: 14.9 % (ref 11.5–15.5)
WBC: 14.2 10*3/uL — ABNORMAL HIGH (ref 4.0–10.5)

## 2014-12-05 LAB — POC URINE PREG, ED: Preg Test, Ur: NEGATIVE

## 2014-12-05 LAB — URINE MICROSCOPIC-ADD ON

## 2014-12-05 LAB — LIPASE, BLOOD: Lipase: 19 U/L (ref 11–59)

## 2014-12-05 MED ORDER — ONDANSETRON 4 MG PO TBDP: ORAL_TABLET | ORAL | Status: DC

## 2014-12-05 MED ORDER — SODIUM CHLORIDE 0.9 % IV BOLUS (SEPSIS)
1000.0000 mL | Freq: Once | INTRAVENOUS | Status: AC
  Administered 2014-12-05: 1000 mL via INTRAVENOUS

## 2014-12-05 MED ORDER — ONDANSETRON HCL 4 MG/2ML IJ SOLN
4.0000 mg | Freq: Once | INTRAMUSCULAR | Status: AC
  Administered 2014-12-05: 4 mg via INTRAVENOUS
  Filled 2014-12-05: qty 2

## 2014-12-05 MED ORDER — IOHEXOL 300 MG/ML  SOLN: 50.0000 mL | Freq: Once | INTRAMUSCULAR | Status: DC | PRN

## 2014-12-05 MED ORDER — IOHEXOL 300 MG/ML  SOLN
50.0000 mL | Freq: Once | INTRAMUSCULAR | Status: AC | PRN
  Administered 2014-12-05: 50 mL via ORAL

## 2014-12-05 MED ORDER — IOHEXOL 300 MG/ML  SOLN
100.0000 mL | Freq: Once | INTRAMUSCULAR | Status: AC | PRN
  Administered 2014-12-05: 100 mL via INTRAVENOUS

## 2014-12-05 NOTE — ED Notes (Signed)
Patient c/o mid abdominal pain since yesterday at 0200 and states she is having lower abdominal pain and dysuria. Patient denies any vaginal discharge.

## 2014-12-05 NOTE — Discharge Instructions (Signed)

## 2014-12-05 NOTE — ED Provider Notes (Signed)
CSN: [id number]     Arrival date & time 12/05/14  0810 History   First MD Initiated Contact with Patient 12/05/14 [phone]     Chief Complaint  Patient presents with  . Abdominal Pain  . Dysuria     (Consider location/radiation/quality/duration/timing/severity/associated sxs/prior Treatment) Patient is a 39 y.o. female presenting with abdominal pain and dysuria.  Abdominal Pain Pain location:  RLQ Pain quality: sharp   Pain radiates to:  Does not radiate Pain severity:  Moderate Onset quality:  Gradual Duration:  2 days Timing:  Constant Progression:  Worsening Chronicity:  New Context: previous surgery (BTL)   Relieved by:  Nothing Worsened by:  Movement Associated symptoms: constipation, dysuria and vaginal bleeding (finished LMP)   Associated symptoms: no diarrhea, no fatigue and no vaginal discharge   Dysuria Associated symptoms: abdominal pain   Associated symptoms: no vaginal discharge     Past Medical History  Diagnosis Date  . Endometriosis   . Anemia    Past Surgical History  Procedure Laterality Date  . Laproscopy    . Tubal ligation     Family History  Problem Relation Age of Onset  . Thyroid disease Neg Hx   . Diabetes Father   . Hypertension Father    History  Substance Use Topics  . Smoking status: Never Smoker   . Smokeless tobacco: Never Used  . Alcohol Use: 0.0 oz/week    0 Standard drinks or equivalent per week     Comment: rarely   OB History    No data available     Review of Systems  Constitutional: Negative for fatigue.  Gastrointestinal: Positive for abdominal pain and constipation. Negative for diarrhea.  Genitourinary: Positive for dysuria and vaginal bleeding (finished LMP). Negative for vaginal discharge.  All other systems reviewed and are negative.     Allergies  Review of patient's allergies indicates no known allergies.  Home Medications   Prior to Admission medications   Medication Sig Start Date End Date Taking?  Authorizing Provider  albuterol (PROVENTIL) (2.5 MG/3ML) 0.083% nebulizer solution Take 2.5 mg by nebulization every 6 (six) hours as needed for wheezing or shortness of breath.   Yes Historical Provider, MD  Biotin w/ Vitamins C & E 1250-7.5-7.5 MCG-MG-UNT CHEW Chew 1 capsule by mouth daily.   Yes Historical Provider, MD  budesonide-formoterol (SYMBICORT) 160-4.5 MCG/ACT inhaler Inhale 2 puffs into the lungs 2 (two) times daily as needed (for shortness of breath).    Yes Historical Provider, MD  Cyanocobalamin (VITAMIN B 12 PO) Take 1 tablet by mouth daily.   Yes Historical Provider, MD  Hydrocodone-Acetaminophen (NORCO PO) Take 1 tablet by mouth once.   Yes Historical Provider, MD  Multiple Vitamin (MULTIVITAMIN WITH MINERALS) TABS tablet Take 2 tablets by mouth daily.   Yes Historical Provider, MD  naproxen sodium (ANAPROX) 220 MG tablet Take 220 mg by mouth 2 (two) times daily as needed (for pain).   Yes Historical Provider, MD  pediatric multivitamin-iron (POLY-VI-SOL WITH IRON) 15 MG chewable tablet Chew 1 tablet by mouth daily.   Yes Historical Provider, MD  PRESCRIPTION MEDICATION once. Steroid injections in right wrist at dr's office   Yes Historical Provider, MD  ondansetron (ZOFRAN ODT) 4 MG disintegrating tablet 4mg  ODT q4 hours prn nausea/vomit 12/05/14   Debby Freiberg, MD   BP 113/61 mmHg  Pulse 88  Temp(Src) 98.7 F (37.1 C) (Oral)  Resp 18  SpO2 99%  LMP 12/04/2014 Physical Exam  Constitutional: She is oriented  to person, place, and time. She appears well-developed and well-nourished.  HENT:  Head: Normocephalic and atraumatic.  Right Ear: External ear normal.  Left Ear: External ear normal.  Eyes: Conjunctivae and EOM are normal. Pupils are equal, round, and reactive to light.  Neck: Normal range of motion. Neck supple.  Cardiovascular: Normal rate, regular rhythm, normal heart sounds and intact distal pulses.   Pulmonary/Chest: Effort normal and breath sounds normal.   Abdominal: Soft. Bowel sounds are normal. There is tenderness in the right lower quadrant and left lower quadrant.  Musculoskeletal: Normal range of motion.  Neurological: She is alert and oriented to person, place, and time.  Skin: Skin is warm and dry.  Vitals reviewed.   ED Course  Procedures (including critical care time) Labs Review Labs Reviewed  URINALYSIS, ROUTINE W REFLEX MICROSCOPIC - Abnormal; Notable for the following:    Color, Urine AMBER (*)    APPearance CLOUDY (*)    Hgb urine dipstick MODERATE (*)    Bilirubin Urine SMALL (*)    Protein, ur 100 (*)    All other components within normal limits  CBC WITH DIFFERENTIAL/PLATELET - Abnormal; Notable for the following:    WBC 14.2 (*)    Hemoglobin 11.1 (*)    HCT 33.9 (*)    Neutrophils Relative % 84 (*)    Neutro Abs 11.8 (*)    Lymphocytes Relative 10 (*)    All other components within normal limits  COMPREHENSIVE METABOLIC PANEL - Abnormal; Notable for the following:    Sodium 133 (*)    Glucose, Bld 104 (*)    Anion gap 4 (*)    All other components within normal limits  URINE MICROSCOPIC-ADD ON - Abnormal; Notable for the following:    Squamous Epithelial / LPF MANY (*)    Bacteria, UA MANY (*)    All other components within normal limits  LIPASE, BLOOD  POC URINE PREG, ED    Imaging Review Ct Abdomen Pelvis W Contrast  12/05/2014   CLINICAL DATA:  Two day history of right lower quadrant pain and dysuria  EXAM: CT ABDOMEN AND PELVIS WITH CONTRAST  TECHNIQUE: Multidetector CT imaging of the abdomen and pelvis was performed using the standard protocol following bolus administration of intravenous contrast. Oral contrast was also administered.  CONTRAST:  13mL OMNIPAQUE IOHEXOL 300 MG/ML SOLN, 81mL OMNIPAQUE IOHEXOL 300 MG/ML SOLN  COMPARISON:  None.  FINDINGS: There is atelectatic change in the left lung base. Lung bases are otherwise clear.  Liver is prominent, measuring 21.4 cm in length. There is hepatic  steatosis. No focal liver lesions are identified. The gallbladder wall is not thickened. There is no biliary duct dilatation.  Spleen, pancreas, and adrenals appear normal. Kidneys bilaterally show no mass or hydronephrosis on either side. There is no renal or ureteral calculus on either side.  In the pelvis, the urinary bladder is midline with normal wall thickness. There is an intrauterine device within the endometrium. The rectum is slightly distended with stool. There is no pelvic or adnexal mass.  There is thickening of the wall of the terminal ileum with extensive surrounding inflammation. Several small lymph nodes in this area are noted, likely inflammatory in etiology. The nearby appendix is normal in size and contour without appreciable appendiceal wall thickening.  There is a small ventral hernia containing only fat.  There is no bowel obstruction.  No free air or portal venous air.  There is no ascites or abscess in the abdomen  or pelvis. Small lymph nodes in the right abdomen are noted. By size criteria, there is no frank adenopathy in the abdomen or pelvis.  Aorta is nonaneurysmal.  There are no blastic or lytic bone lesions.  IMPRESSION: Evidence of terminal ileitis with surrounding mesenteric inflammation and several small surrounding lymph nodes. The remainder of the bowel appears unremarkable without appreciable wall thickening. No bowel obstruction. No frank abscess. Nearby appendix appears unremarkable. Differential considerations for this appearance of the terminal ileum include Crohn's disease and infectious ileitis.  Liver is prominent with hepatic steatosis.  Small ventral hernia containing only fat.  No renal or ureteral calculus.  No hydronephrosis.  Intrauterine device positioned within the endometrium.   Electronically Signed   By: Lowella Grip III M.D.   On: 12/05/2014 11:07     EKG Interpretation None      MDM   Final diagnoses:  Enteritis  Ileitis    39 y.o. female  with pertinent PMH of endometriosis, anemia presents with abd pain as above.  Sent from PCP for ro appy.  Physical exam today as above.  Exam with bil lq tenderness.  Pt has ileitis on CT scan, which is likely etiology of symptoms.  Likely viral given nature of symptoms.  Given strict return precautions.  No vaginal dc or other concerning findings, and history is atypical for ovarian torsion or TOA, with negative CT makes this less likely.    I have reviewed all laboratory and imaging studies if ordered as above  1. Enteritis   2. Ileitis         Debby Freiberg, MD 12/06/14 [phone]

## 2014-12-10 ENCOUNTER — Emergency Department (HOSPITAL_COMMUNITY)
Admission: EM | Admit: 2014-12-10 | Discharge: 2014-12-11 | Disposition: A | Discharge status: Home / Self Care | Payer: 59 | Attending: Emergency Medicine | Admitting: Emergency Medicine

## 2014-12-10 ENCOUNTER — Encounter (HOSPITAL_COMMUNITY): Payer: Self-pay | Admitting: Emergency Medicine

## 2014-12-10 ENCOUNTER — Emergency Department (HOSPITAL_COMMUNITY)
Admission: EM | Admit: 2014-12-10 | Discharge: 2014-12-10 | Disposition: A | Discharge status: Home / Self Care | Payer: 59

## 2014-12-10 DIAGNOSIS — Z862 Personal history of diseases of the blood and blood-forming organs and certain disorders involving the immune mechanism: Secondary | ICD-10-CM | POA: Insufficient documentation

## 2014-12-10 DIAGNOSIS — K529 Noninfective gastroenteritis and colitis, unspecified: Secondary | ICD-10-CM

## 2014-12-10 DIAGNOSIS — K920 Hematemesis: Secondary | ICD-10-CM | POA: Diagnosis present

## 2014-12-10 DIAGNOSIS — Z9851 Tubal ligation status: Secondary | ICD-10-CM | POA: Diagnosis not present

## 2014-12-10 DIAGNOSIS — Z8742 Personal history of other diseases of the female genital tract: Secondary | ICD-10-CM | POA: Diagnosis not present

## 2014-12-10 LAB — CBC WITH DIFFERENTIAL/PLATELET
Basophils Absolute: 0.1 10*3/uL (ref 0.0–0.1)
Basophils Relative: 1 % (ref 0–1)
EOS ABS: 0 10*3/uL (ref 0.0–0.7)
EOS PCT: 0 % (ref 0–5)
HCT: 34.5 % — ABNORMAL LOW (ref 36.0–46.0)
Hemoglobin: 11.6 g/dL — ABNORMAL LOW (ref 12.0–15.0)
Lymphocytes Relative: 11 % — ABNORMAL LOW (ref 12–46)
Lymphs Abs: 0.9 10*3/uL (ref 0.7–4.0)
MCH: 28.3 pg (ref 26.0–34.0)
MCHC: 33.6 g/dL (ref 30.0–36.0)
MCV: 84.1 fL (ref 78.0–100.0)
MONO ABS: 0.2 10*3/uL (ref 0.1–1.0)
Monocytes Relative: 2 % — ABNORMAL LOW (ref 3–12)
Neutro Abs: 6.8 10*3/uL (ref 1.7–7.7)
Neutrophils Relative %: 86 % — ABNORMAL HIGH (ref 43–77)
PLATELETS: 417 10*3/uL — AB (ref 150–400)
RBC: 4.1 MIL/uL (ref 3.87–5.11)
RDW: 14.3 % (ref 11.5–15.5)
WBC: 8 10*3/uL (ref 4.0–10.5)

## 2014-12-10 LAB — URINALYSIS, ROUTINE W REFLEX MICROSCOPIC
Bilirubin Urine: NEGATIVE
Glucose, UA: NEGATIVE mg/dL
HGB URINE DIPSTICK: NEGATIVE
Ketones, ur: >80 mg/dL — AB
LEUKOCYTES UA: NEGATIVE
Nitrite: NEGATIVE
Protein, ur: 100 mg/dL — AB
Specific Gravity, Urine: 1.035 — ABNORMAL HIGH (ref 1.005–1.030)
Urobilinogen, UA: 0.2 mg/dL (ref 0.0–1.0)
pH: 7.5 (ref 5.0–8.0)

## 2014-12-10 LAB — COMPREHENSIVE METABOLIC PANEL
ALBUMIN: 3.9 g/dL (ref 3.5–5.2)
ALT: 16 U/L (ref 0–35)
AST: 23 U/L (ref 0–37)
Alkaline Phosphatase: 96 U/L (ref 39–117)
Anion gap: 12 (ref 5–15)
BILIRUBIN TOTAL: 0.5 mg/dL (ref 0.3–1.2)
BUN: 6 mg/dL (ref 6–23)
CHLORIDE: 105 mmol/L (ref 96–112)
CO2: 20 mmol/L (ref 19–32)
CREATININE: 0.78 mg/dL (ref 0.50–1.10)
Calcium: 9.4 mg/dL (ref 8.4–10.5)
GFR calc Af Amer: >90 mL/min (ref >90)
Glucose, Bld: 147 mg/dL — ABNORMAL HIGH (ref 70–99)
Potassium: 3.5 mmol/L (ref 3.5–5.1)
Sodium: 137 mmol/L (ref 135–145)
Total Protein: 8.3 g/dL (ref 6.0–8.3)

## 2014-12-10 LAB — URINE MICROSCOPIC-ADD ON

## 2014-12-10 LAB — LIPASE, BLOOD: LIPASE: 14 U/L (ref 11–59)

## 2014-12-10 MED ORDER — HYDROMORPHONE HCL 1 MG/ML IJ SOLN
1.0000 mg | Freq: Once | INTRAMUSCULAR | Status: AC
  Administered 2014-12-10: 1 mg via INTRAVENOUS
  Filled 2014-12-10: qty 1

## 2014-12-10 MED ORDER — ONDANSETRON HCL 4 MG/2ML IJ SOLN
4.0000 mg | Freq: Once | INTRAMUSCULAR | Status: AC
  Administered 2014-12-10: 4 mg via INTRAVENOUS
  Filled 2014-12-10: qty 2

## 2014-12-10 MED ORDER — SODIUM CHLORIDE 0.9 % IV BOLUS (SEPSIS)
1000.0000 mL | Freq: Once | INTRAVENOUS | Status: AC
  Administered 2014-12-10: 1000 mL via INTRAVENOUS

## 2014-12-10 NOTE — ED Notes (Signed)
Pt c/o abd pain, dizziness, vomiting blood and passing blood in stool, pt was here earlier left before triage due to not being able to go back immediately.

## 2014-12-10 NOTE — ED Provider Notes (Signed)
CSN: [id number]     Arrival date & time 12/10/14  1817 History   First MD Initiated Contact with Patient 12/10/14 2149     Chief Complaint  Patient presents with  . Vomiting  . Blood In Stools  . Hematemesis     (Consider location/radiation/quality/duration/timing/severity/associated sxs/prior Treatment) HPI Virginia Olson is a 39 year old female with past medical history of endometriosis who presents the ER complaining of abdominal pain. Patient states she was seen for similar signs and symptoms 5 days ago, diagnosed with ileitis which is likely to be viral in nature. She reports mild relief of her symptoms while in the ER, however the past several days and had worsening of her pain, along with several episodes of what she describes as bilious vomit throughout the day today. Patient reports several episodes of a small amount of red tinged sputum in her emesis today, however this has resolved. Patient denies fever, headache, blurred vision, chest pain, shortness of breath. Patient reports she is also been "leaking urine". She reports frequency of urination without any burning sensation or pain.  Past Medical History  Diagnosis Date  . Endometriosis   . Anemia    Past Surgical History  Procedure Laterality Date  . Laproscopy    . Tubal ligation     Family History  Problem Relation Age of Onset  . Thyroid disease Neg Hx   . Diabetes Father   . Hypertension Father    History  Substance Use Topics  . Smoking status: Never Smoker   . Smokeless tobacco: Never Used  . Alcohol Use: 0.0 oz/week    0 Standard drinks or equivalent per week     Comment: rarely   OB History    No data available     Review of Systems  Constitutional: Negative for fever.  HENT: Negative for trouble swallowing.   Eyes: Negative for visual disturbance.  Respiratory: Negative for shortness of breath.   Cardiovascular: Negative for chest pain.  Gastrointestinal: Positive for nausea, vomiting and  abdominal pain.  Genitourinary: Positive for dysuria.  Musculoskeletal: Negative for neck pain.  Skin: Negative for rash.  Neurological: Negative for dizziness, weakness and numbness.  Psychiatric/Behavioral: Negative.       Allergies  Review of patient's allergies indicates no known allergies.  Home Medications   Prior to Admission medications   Medication Sig Start Date End Date Taking? Authorizing Provider  acetaminophen (TYLENOL) 500 MG tablet Take 1,000 mg by mouth every 6 (six) hours as needed for moderate pain (pain).   Yes Historical Provider, MD  ondansetron (ZOFRAN ODT) 4 MG disintegrating tablet 4mg  ODT q4 hours prn nausea/vomit 12/05/14  Yes Debby Freiberg, MD  cephALEXin (KEFLEX) 500 MG capsule Take 1 capsule (500 mg total) by mouth 2 (two) times daily. 12/11/14   Dahlia Bailiff, PA-C  ondansetron (ZOFRAN) 4 MG tablet Take 1 tablet (4 mg total) by mouth every 8 (eight) hours as needed for nausea or vomiting. 12/11/14   Dahlia Bailiff, PA-C  oxyCODONE-acetaminophen (PERCOCET) 5-325 MG per tablet Take 1-2 tablets by mouth every 6 (six) hours as needed. 12/11/14   Dahlia Bailiff, PA-C   BP 131/68 mmHg  Pulse 75  Temp(Src) 98.3 F (36.8 C) (Oral)  Resp 18  SpO2 99%  LMP 12/04/2014 Physical Exam  Constitutional: She is oriented to person, place, and time. She appears well-developed and well-nourished.  And a moderate amount of distress due to pain.  HENT:  Head: Normocephalic and atraumatic.  Mouth/Throat: Oropharynx is clear and  moist. No oropharyngeal exudate.  Eyes: Right eye exhibits no discharge. Left eye exhibits no discharge. No scleral icterus.  Neck: Normal range of motion.  Cardiovascular: Normal rate, regular rhythm and normal heart sounds.   No murmur heard. Pulmonary/Chest: Effort normal and breath sounds normal. No respiratory distress.  Abdominal: Soft. Normal appearance and bowel sounds are normal. There is tenderness in the right upper quadrant, right lower quadrant and  periumbilical area. There is tenderness at McBurney's point. There is no rigidity, no guarding and negative Murphy's sign.  Musculoskeletal: Normal range of motion. She exhibits no edema or tenderness.  Neurological: She is alert and oriented to person, place, and time. No cranial nerve deficit. Coordination normal.  Skin: Skin is warm and dry. No rash noted. She is not diaphoretic.  Psychiatric: She has a normal mood and affect.  Nursing note and vitals reviewed.   ED Course  Procedures (including critical care time) Labs Review Labs Reviewed  COMPREHENSIVE METABOLIC PANEL - Abnormal; Notable for the following:    Glucose, Bld 147 (*)    All other components within normal limits  CBC WITH DIFFERENTIAL/PLATELET - Abnormal; Notable for the following:    Hemoglobin 11.6 (*)    HCT 34.5 (*)    Platelets 417 (*)    Neutrophils Relative % 86 (*)    Lymphocytes Relative 11 (*)    Monocytes Relative 2 (*)    All other components within normal limits  URINALYSIS, ROUTINE W REFLEX MICROSCOPIC - Abnormal; Notable for the following:    APPearance CLOUDY (*)    Specific Gravity, Urine 1.035 (*)    Ketones, ur >80 (*)    Protein, ur 100 (*)    All other components within normal limits  URINE MICROSCOPIC-ADD ON - Abnormal; Notable for the following:    Squamous Epithelial / LPF FEW (*)    Bacteria, UA MANY (*)    All other components within normal limits  URINE CULTURE  LIPASE, BLOOD  PREGNANCY, URINE  GI PATHOGEN PANEL BY PCR, STOOL    Imaging Review No results found.   EKG Interpretation None      MDM   Final diagnoses:  Ileitis   Patient returning with right lower quadrant pain consistent with the pain she was evaluated for 5 days ago and diagnosed with ileitis. Clinically patient's pain is consistent with the CT findings, do not believe further imaging is required tonight. Patient treated symptomatically. Labs unremarkable for acute pathology. No leukocytosis or anemia from  patient's baseline. Lipase normal. Electrolytes within normal limits. Renal function intact. Liver enzymes normal. Urinalysis with many bacteria, will send culture and this patient on a course of Keflex. After symptomatic therapy, patient reports being asymptomatic.  Patient is nontoxic, nonseptic appearing, in no apparent distress.  Patient's pain and other symptoms adequately managed in emergency department.  Fluid bolus given.  Labs, imaging and vitals reviewed.  Patient does not meet the SIRS or Sepsis criteria.  On repeat exam patient does not have a surgical abdomin and there are no peritoneal signs.  No indication of appendicitis, bowel obstruction, bowel perforation, cholecystitis, diverticulitis, PID or ectopic pregnancy.  Patient discharged home with symptomatic treatment and given strict instructions for follow-up with gastroenterology. I discussed return precautions with patient, patient was understanding this plan. I encouraged patient to call or return to ER should she have any questions or concerns.  BP 131/68 mmHg  Pulse 75  Temp(Src) 98.3 F (36.8 C) (Oral)  Resp 18  SpO2 99%  LMP 12/04/2014  Signed,  Dahlia Bailiff, PA-C 1:15 AM  Patient discussed with Dr. Jola Schmidt, M.D.       Dahlia Bailiff, PA-C 12/11/14 0115  Jola Schmidt, MD 12/11/14 [phone]

## 2014-12-10 NOTE — ED Notes (Signed)
Pt stated tat she was leaving if she could not go to a bed in the back now. Explaiend to pt that she would have to have the triage process preformed and pt stated multiple times she was leaving

## 2014-12-11 MED ORDER — ONDANSETRON HCL 4 MG PO TABS: 4.0000 mg | ORAL_TABLET | Freq: Three times a day (TID) | ORAL | Status: DC | PRN

## 2014-12-11 MED ORDER — OXYCODONE-ACETAMINOPHEN 5-325 MG PO TABS: 1.0000 | ORAL_TABLET | Freq: Four times a day (QID) | ORAL | Status: DC | PRN

## 2014-12-11 MED ORDER — CEPHALEXIN 500 MG PO CAPS: 500.0000 mg | ORAL_CAPSULE | Freq: Two times a day (BID) | ORAL | Status: DC

## 2014-12-11 NOTE — Discharge Instructions (Signed)
Follow-up with gastroenterology. Return to the ER if any severe abdominal pain, inability to keep food or fluids down, high fever, worsening of symptoms.  Viral Gastroenteritis Viral gastroenteritis is also known as stomach flu. This condition affects the stomach and intestinal tract. It can cause sudden diarrhea and vomiting. The illness typically lasts 3 to 8 days. Most people develop an immune response that eventually gets rid of the virus. While this natural response develops, the virus can make you quite ill. CAUSES  Many different viruses can cause gastroenteritis, such as rotavirus or noroviruses. You can catch one of these viruses by consuming contaminated food or water. You may also catch a virus by sharing utensils or other personal items with an infected person or by touching a contaminated surface. SYMPTOMS  The most common symptoms are diarrhea and vomiting. These problems can cause a severe loss of body fluids (dehydration) and a body salt (electrolyte) imbalance. Other symptoms may include:  Fever.  Headache.  Fatigue.  Abdominal pain. DIAGNOSIS  Your caregiver can usually diagnose viral gastroenteritis based on your symptoms and a physical exam. A stool sample may also be taken to test for the presence of viruses or other infections. TREATMENT  This illness typically goes away on its own. Treatments are aimed at rehydration. The most serious cases of viral gastroenteritis involve vomiting so severely that you are not able to keep fluids down. In these cases, fluids must be given through an intravenous line (IV). HOME CARE INSTRUCTIONS   Drink enough fluids to keep your urine clear or pale yellow. Drink small amounts of fluids frequently and increase the amounts as tolerated.  Ask your caregiver for specific rehydration instructions.  Avoid:  Foods high in sugar.  Alcohol.  Carbonated drinks.  Tobacco.  Juice.  Caffeine drinks.  Extremely hot or cold  fluids.  Fatty, greasy foods.  Too much intake of anything at one time.  Dairy products until 24 to 48 hours after diarrhea stops.  You may consume probiotics. Probiotics are active cultures of beneficial bacteria. They may lessen the amount and number of diarrheal stools in adults. Probiotics can be found in yogurt with active cultures and in supplements.  Wash your hands well to avoid spreading the virus.  Only take over-the-counter or prescription medicines for pain, discomfort, or fever as directed by your caregiver. Do not give aspirin to children. Antidiarrheal medicines are not recommended.  Ask your caregiver if you should continue to take your regular prescribed and over-the-counter medicines.  Keep all follow-up appointments as directed by your caregiver. SEEK IMMEDIATE MEDICAL CARE IF:   You are unable to keep fluids down.  You do not urinate at least once every 6 to 8 hours.  You develop shortness of breath.  You notice blood in your stool or vomit. This may look like coffee grounds.  You have abdominal pain that increases or is concentrated in one small area (localized).  You have persistent vomiting or diarrhea.  You have a fever.  The patient is a child younger than 3 months, and he or she has a fever.  The patient is a child older than 3 months, and he or she has a fever and persistent symptoms.  The patient is a child older than 3 months, and he or she has a fever and symptoms suddenly get worse.  The patient is a baby, and he or she has no tears when crying. MAKE SURE YOU:   Understand these instructions.  Will watch your  condition.  Will get help right away if you are not doing well or get worse. Document Released: 09/19/2005 Document Revised: 12/12/2011 Document Reviewed: 07/06/2011 [facility] Patient Information 2015 [place], [place]. This information is not intended to replace advice given to you by your health care provider. Make sure you discuss  any questions you have with your health care provider.  Urinary Tract Infection Urinary tract infections (UTIs) can develop anywhere along your urinary tract. Your urinary tract is your body's drainage system for removing wastes and extra water. Your urinary tract includes two kidneys, two ureters, a bladder, and a urethra. Your kidneys are a pair of bean-shaped organs. Each kidney is about the size of your fist. They are located below your ribs, one on each side of your spine. CAUSES Infections are caused by microbes, which are microscopic organisms, including fungi, viruses, and bacteria. These organisms are so small that they can only be seen through a microscope. Bacteria are the microbes that most commonly cause UTIs. SYMPTOMS  Symptoms of UTIs may vary by age and gender of the patient and by the location of the infection. Symptoms in young women typically include a frequent and intense urge to urinate and a painful, burning feeling in the bladder or urethra during urination. Older women and men are more likely to be tired, shaky, and weak and have muscle aches and abdominal pain. A fever may mean the infection is in your kidneys. Other symptoms of a kidney infection include pain in your back or sides below the ribs, nausea, and vomiting. DIAGNOSIS To diagnose a UTI, your caregiver will ask you about your symptoms. Your caregiver also will ask to provide a urine sample. The urine sample will be tested for bacteria and white blood cells. White blood cells are made by your body to help fight infection. TREATMENT  Typically, UTIs can be treated with medication. Because most UTIs are caused by a bacterial infection, they usually can be treated with the use of antibiotics. The choice of antibiotic and length of treatment depend on your symptoms and the type of bacteria causing your infection. HOME CARE INSTRUCTIONS  If you were prescribed antibiotics, take them exactly as your caregiver instructs you.  Finish the medication even if you feel better after you have only taken some of the medication.  Drink enough water and fluids to keep your urine clear or pale yellow.  Avoid caffeine, tea, and carbonated beverages. They tend to irritate your bladder.  Empty your bladder often. Avoid holding urine for long periods of time.  Empty your bladder before and after sexual intercourse.  After a bowel movement, women should cleanse from front to back. Use each tissue only once. SEEK MEDICAL CARE IF:   You have back pain.  You develop a fever.  Your symptoms do not begin to resolve within 3 days. SEEK IMMEDIATE MEDICAL CARE IF:   You have severe back pain or lower abdominal pain.  You develop chills.  You have nausea or vomiting.  You have continued burning or discomfort with urination. MAKE SURE YOU:   Understand these instructions.  Will watch your condition.  Will get help right away if you are not doing well or get worse. Document Released: 06/29/2005 Document Revised: 03/20/2012 Document Reviewed: 10/28/2011 [facility] Patient Information 2015 [place], [place]. This information is not intended to replace advice given to you by your health care provider. Make sure you discuss any questions you have with your health care provider.

## 2014-12-12 ENCOUNTER — Inpatient Hospital Stay (HOSPITAL_COMMUNITY)
Admission: EM | Admit: 2014-12-12 | Discharge: 2014-12-18 | DRG: 386 | Disposition: A | Discharge status: Home / Self Care | Payer: 59 | Attending: Internal Medicine | Admitting: Internal Medicine

## 2014-12-12 ENCOUNTER — Encounter (HOSPITAL_COMMUNITY): Payer: Self-pay | Admitting: Emergency Medicine

## 2014-12-12 ENCOUNTER — Inpatient Hospital Stay (HOSPITAL_COMMUNITY): Payer: 59

## 2014-12-12 DIAGNOSIS — D509 Iron deficiency anemia, unspecified: Secondary | ICD-10-CM | POA: Diagnosis present

## 2014-12-12 DIAGNOSIS — R6883 Chills (without fever): Secondary | ICD-10-CM

## 2014-12-12 DIAGNOSIS — D649 Anemia, unspecified: Secondary | ICD-10-CM | POA: Diagnosis present

## 2014-12-12 DIAGNOSIS — Z8249 Family history of ischemic heart disease and other diseases of the circulatory system: Secondary | ICD-10-CM

## 2014-12-12 DIAGNOSIS — E876 Hypokalemia: Secondary | ICD-10-CM

## 2014-12-12 DIAGNOSIS — Z79891 Long term (current) use of opiate analgesic: Secondary | ICD-10-CM | POA: Diagnosis not present

## 2014-12-12 DIAGNOSIS — R112 Nausea with vomiting, unspecified: Secondary | ICD-10-CM | POA: Diagnosis present

## 2014-12-12 DIAGNOSIS — D219 Benign neoplasm of connective and other soft tissue, unspecified: Secondary | ICD-10-CM

## 2014-12-12 DIAGNOSIS — K5000 Crohn's disease of small intestine without complications: Secondary | ICD-10-CM | POA: Diagnosis present

## 2014-12-12 DIAGNOSIS — K529 Noninfective gastroenteritis and colitis, unspecified: Secondary | ICD-10-CM

## 2014-12-12 DIAGNOSIS — R109 Unspecified abdominal pain: Secondary | ICD-10-CM | POA: Diagnosis not present

## 2014-12-12 DIAGNOSIS — R591 Generalized enlarged lymph nodes: Secondary | ICD-10-CM | POA: Diagnosis present

## 2014-12-12 DIAGNOSIS — Z79899 Other long term (current) drug therapy: Secondary | ICD-10-CM | POA: Diagnosis not present

## 2014-12-12 DIAGNOSIS — Z792 Long term (current) use of antibiotics: Secondary | ICD-10-CM

## 2014-12-12 DIAGNOSIS — E860 Dehydration: Secondary | ICD-10-CM | POA: Diagnosis present

## 2014-12-12 DIAGNOSIS — N390 Urinary tract infection, site not specified: Secondary | ICD-10-CM | POA: Diagnosis present

## 2014-12-12 DIAGNOSIS — R1030 Lower abdominal pain, unspecified: Secondary | ICD-10-CM

## 2014-12-12 DIAGNOSIS — R1110 Vomiting, unspecified: Secondary | ICD-10-CM

## 2014-12-12 DIAGNOSIS — K5790 Diverticulosis of intestine, part unspecified, without perforation or abscess without bleeding: Secondary | ICD-10-CM | POA: Diagnosis present

## 2014-12-12 DIAGNOSIS — Z833 Family history of diabetes mellitus: Secondary | ICD-10-CM | POA: Diagnosis not present

## 2014-12-12 HISTORY — DX: Benign neoplasm of connective and other soft tissue, unspecified: D21.9

## 2014-12-12 LAB — URINALYSIS, ROUTINE W REFLEX MICROSCOPIC
BILIRUBIN URINE: NEGATIVE
Glucose, UA: NEGATIVE mg/dL
Leukocytes, UA: NEGATIVE
NITRITE: NEGATIVE
Protein, ur: 30 mg/dL — AB
Specific Gravity, Urine: 1.031 — ABNORMAL HIGH (ref 1.005–1.030)
UROBILINOGEN UA: 0.2 mg/dL (ref 0.0–1.0)
pH: 6 (ref 5.0–8.0)

## 2014-12-12 LAB — URINE MICROSCOPIC-ADD ON

## 2014-12-12 LAB — COMPREHENSIVE METABOLIC PANEL
ALT: 16 U/L (ref 0–35)
AST: 25 U/L (ref 0–37)
Albumin: 3.9 g/dL (ref 3.5–5.2)
Alkaline Phosphatase: 92 U/L (ref 39–117)
Anion gap: 10 (ref 5–15)
BILIRUBIN TOTAL: 0.4 mg/dL (ref 0.3–1.2)
BUN: 7 mg/dL (ref 6–23)
CHLORIDE: 106 mmol/L (ref 96–112)
CO2: 21 mmol/L (ref 19–32)
CREATININE: 0.76 mg/dL (ref 0.50–1.10)
Calcium: 9 mg/dL (ref 8.4–10.5)
GFR calc Af Amer: >90 mL/min (ref >90)
Glucose, Bld: 124 mg/dL — ABNORMAL HIGH (ref 70–99)
Potassium: 3.4 mmol/L — ABNORMAL LOW (ref 3.5–5.1)
Sodium: 137 mmol/L (ref 135–145)
Total Protein: 8.3 g/dL (ref 6.0–8.3)

## 2014-12-12 LAB — CBC WITH DIFFERENTIAL/PLATELET
BASOS ABS: 0 10*3/uL (ref 0.0–0.1)
Basophils Relative: 0 % (ref 0–1)
EOS PCT: 0 % (ref 0–5)
Eosinophils Absolute: 0 10*3/uL (ref 0.0–0.7)
HCT: 35.3 % — ABNORMAL LOW (ref 36.0–46.0)
HEMOGLOBIN: 11.8 g/dL — AB (ref 12.0–15.0)
LYMPHS PCT: 11 % — AB (ref 12–46)
Lymphs Abs: 1.1 10*3/uL (ref 0.7–4.0)
MCH: 28.2 pg (ref 26.0–34.0)
MCHC: 33.4 g/dL (ref 30.0–36.0)
MCV: 84.4 fL (ref 78.0–100.0)
Monocytes Absolute: 0.3 10*3/uL (ref 0.1–1.0)
Monocytes Relative: 3 % (ref 3–12)
NEUTROS PCT: 86 % — AB (ref 43–77)
Neutro Abs: 8.2 10*3/uL — ABNORMAL HIGH (ref 1.7–7.7)
Platelets: 464 10*3/uL — ABNORMAL HIGH (ref 150–400)
RBC: 4.18 MIL/uL (ref 3.87–5.11)
RDW: 14.4 % (ref 11.5–15.5)
WBC: 9.6 10*3/uL (ref 4.0–10.5)

## 2014-12-12 LAB — LACTIC ACID, PLASMA: LACTIC ACID, VENOUS: 0.8 mmol/L (ref 0.5–2.0)

## 2014-12-12 LAB — MAGNESIUM: Magnesium: 1.6 mg/dL (ref 1.5–2.5)

## 2014-12-12 LAB — PROCALCITONIN: Procalcitonin: <0.1 ng/mL

## 2014-12-12 LAB — URINE CULTURE: Colony Count: 8000

## 2014-12-12 LAB — LIPASE, BLOOD: Lipase: 14 U/L (ref 11–59)

## 2014-12-12 MED ORDER — POLYETHYLENE GLYCOL 3350 17 G PO PACK: 17.0000 g | PACK | Freq: Every day | ORAL | Status: DC | PRN

## 2014-12-12 MED ORDER — ONDANSETRON HCL 4 MG/2ML IJ SOLN
4.0000 mg | Freq: Four times a day (QID) | INTRAMUSCULAR | Status: DC
  Administered 2014-12-12 – 2014-12-14 (×7): 4 mg via INTRAVENOUS
  Filled 2014-12-12 (×7): qty 2

## 2014-12-12 MED ORDER — FLEET ENEMA 7-19 GM/118ML RE ENEM: 1.0000 | ENEMA | Freq: Once | RECTAL | Status: AC | PRN

## 2014-12-12 MED ORDER — PROCHLORPERAZINE EDISYLATE 5 MG/ML IJ SOLN
10.0000 mg | Freq: Four times a day (QID) | INTRAMUSCULAR | Status: DC | PRN
  Administered 2014-12-13 – 2014-12-15 (×4): 10 mg via INTRAVENOUS
  Filled 2014-12-12 (×4): qty 2

## 2014-12-12 MED ORDER — ONDANSETRON HCL 4 MG/2ML IJ SOLN: 4.0000 mg | Freq: Four times a day (QID) | INTRAMUSCULAR | Status: DC | PRN

## 2014-12-12 MED ORDER — ONDANSETRON HCL 4 MG/2ML IJ SOLN
4.0000 mg | Freq: Once | INTRAMUSCULAR | Status: AC
  Administered 2014-12-12: 4 mg via INTRAVENOUS
  Filled 2014-12-12: qty 2

## 2014-12-12 MED ORDER — SODIUM CHLORIDE 0.9 % IV SOLN: INTRAVENOUS | Status: DC

## 2014-12-12 MED ORDER — ONDANSETRON HCL 4 MG PO TABS: 4.0000 mg | ORAL_TABLET | Freq: Four times a day (QID) | ORAL | Status: DC | PRN

## 2014-12-12 MED ORDER — ACETAMINOPHEN 325 MG PO TABS: 650.0000 mg | ORAL_TABLET | Freq: Four times a day (QID) | ORAL | Status: DC | PRN

## 2014-12-12 MED ORDER — IOHEXOL 300 MG/ML  SOLN
100.0000 mL | Freq: Once | INTRAMUSCULAR | Status: AC | PRN
  Administered 2014-12-12: 100 mL via INTRAVENOUS

## 2014-12-12 MED ORDER — HYDROMORPHONE HCL 1 MG/ML IJ SOLN
1.0000 mg | Freq: Once | INTRAMUSCULAR | Status: AC
  Administered 2014-12-12: 1 mg via INTRAVENOUS
  Filled 2014-12-12: qty 1

## 2014-12-12 MED ORDER — SODIUM CHLORIDE 0.9 % IV BOLUS (SEPSIS)
1000.0000 mL | Freq: Once | INTRAVENOUS | Status: AC
  Administered 2014-12-12: 1000 mL via INTRAVENOUS

## 2014-12-12 MED ORDER — SODIUM CHLORIDE 0.9 % IV SOLN
INTRAVENOUS | Status: DC
  Administered 2014-12-12 – 2014-12-15 (×5): via INTRAVENOUS
  Filled 2014-12-12 (×9): qty 1000

## 2014-12-12 MED ORDER — ONDANSETRON HCL 8 MG PO TABS: 8.0000 mg | ORAL_TABLET | Freq: Four times a day (QID) | ORAL | Status: DC | PRN

## 2014-12-12 MED ORDER — PIPERACILLIN-TAZOBACTAM 3.375 G IVPB
3.3750 g | Freq: Three times a day (TID) | INTRAVENOUS | Status: DC
  Administered 2014-12-12 – 2014-12-17 (×14): 3.375 g via INTRAVENOUS
  Filled 2014-12-12 (×15): qty 50

## 2014-12-12 MED ORDER — ACETAMINOPHEN 650 MG RE SUPP
650.0000 mg | Freq: Four times a day (QID) | RECTAL | Status: DC | PRN
Start: 2014-12-12 — End: 2014-12-18

## 2014-12-12 MED ORDER — SORBITOL 70 % SOLN: 30.0000 mL | Freq: Every day | Status: DC | PRN

## 2014-12-12 MED ORDER — METRONIDAZOLE IN NACL 5-0.79 MG/ML-% IV SOLN: 500.0000 mg | Freq: Three times a day (TID) | INTRAVENOUS | Status: DC

## 2014-12-12 MED ORDER — CIPROFLOXACIN IN D5W 400 MG/200ML IV SOLN: 400.0000 mg | Freq: Two times a day (BID) | INTRAVENOUS | Status: DC

## 2014-12-12 MED ORDER — OXYCODONE HCL 5 MG PO TABS: 5.0000 mg | ORAL_TABLET | ORAL | Status: DC | PRN

## 2014-12-12 MED ORDER — ENOXAPARIN SODIUM 80 MG/0.8ML ~~LOC~~ SOLN
65.0000 mg | SUBCUTANEOUS | Status: DC
  Administered 2014-12-12 – 2014-12-13 (×2): 65 mg via SUBCUTANEOUS
  Filled 2014-12-12 (×2): qty 0.8

## 2014-12-12 MED ORDER — PROMETHAZINE HCL 25 MG/ML IJ SOLN
12.5000 mg | Freq: Once | INTRAMUSCULAR | Status: AC
  Administered 2014-12-12: 12.5 mg via INTRAVENOUS
  Filled 2014-12-12: qty 1

## 2014-12-12 MED ORDER — ONDANSETRON HCL 4 MG/2ML IJ SOLN: 4.0000 mg | Freq: Three times a day (TID) | INTRAMUSCULAR | Status: DC | PRN

## 2014-12-12 MED ORDER — DOCUSATE SODIUM 100 MG PO CAPS
100.0000 mg | ORAL_CAPSULE | Freq: Two times a day (BID) | ORAL | Status: DC
  Administered 2014-12-12 – 2014-12-13 (×3): 100 mg via ORAL
  Filled 2014-12-12 (×5): qty 1

## 2014-12-12 MED ORDER — ALUM & MAG HYDROXIDE-SIMETH 200-200-20 MG/5ML PO SUSP
30.0000 mL | Freq: Four times a day (QID) | ORAL | Status: DC | PRN
Start: 2014-12-12 — End: 2014-12-18
  Administered 2014-12-13: 30 mL via ORAL
  Filled 2014-12-12: qty 30

## 2014-12-12 MED ORDER — SODIUM CHLORIDE 0.9 % IV SOLN
INTRAVENOUS | Status: DC
  Filled 2014-12-12: qty 1000

## 2014-12-12 MED ORDER — MORPHINE SULFATE 2 MG/ML IJ SOLN
2.0000 mg | INTRAMUSCULAR | Status: DC | PRN
  Administered 2014-12-12: 2 mg via INTRAVENOUS
  Filled 2014-12-12: qty 1

## 2014-12-12 MED ORDER — MORPHINE SULFATE 2 MG/ML IJ SOLN
2.0000 mg | INTRAMUSCULAR | Status: DC | PRN
  Administered 2014-12-12 – 2014-12-14 (×11): 2 mg via INTRAVENOUS
  Filled 2014-12-12 (×11): qty 1

## 2014-12-12 NOTE — ED Notes (Signed)
Pt was just seen yesterday for same. Pt c/o abd pain and vomiting. Pt "passed out" in lobby and laid on floor but stated if we were ready for her then she could get up.

## 2014-12-12 NOTE — H&P (Signed)
[facility] History and Physical  Virginia Olson GGY:[id number] DOB: Mar 23, 1976 DOA: 12/12/2014  Referring physician: Jeannett Senior, PA PCP: Antony Blackbird, MD   Chief Complaint: Nausea vomiting abdominal pain  HPI: Virginia Olson is a 39 y.o. female  With history of anemia and endometriosis who presents to the ED complaining of diffuse abdominal pain, nausea and vomiting that have been ongoing times a week. Patient stated that symptoms started 1 week prior to admission with diffuse abdominal pain which alternated between sharp and all with some associated nausea and emesis. Patient presented to the emergency room while with prior to admission was diagnosed with a possible ileitis per CT scan and sent home on anti-medics, and pain medication. Patient stated that continue to have intermittent pain and presented back to the ED 2 days prior to admission with similar symptoms. Patient stated that at that time she was diagnosed with a urinary tract infection placed on some antibiotics and sent home on pain medication. Patient states 1 day prior to admission was doing okay. On the morning of admission patient stated that started to have significant abdominal pain constant in nature with nausea and emesis mostly bilious and dry heaves. Patient endorses some chills and generalized weakness. Patient states hadn't had a bowel movement in approximately 2 days. Patient denies any fevers, no shortness of breath, no diarrhea, no dysuria, no cough, no melena, no hematochezia, no hematemesis. Patient does endorse some chest pain associated with a dry heaves. Patient denies any recent travel. Patient does not drink any well water. No change in her diet. Patient denies any family history of inflammatory bowel disease. Patient was seen in the emergency room comprehensive metabolic profile done had a potassium of 3.4 otherwise was unremarkable. CBC done had a hemoglobin of 11.8 with a platelet count of 464 and a  left shift. Urinalysis done was nitrite negative leukocytes negative with 0-2 WBCs and many bacteria. Specific gravity was 1.031. [facility] were consulted to admit the patient for further evaluation and management.   Review of Systems:  As per history of present illness otherwise negative.  Constitutional:  No weight loss, night sweats, Fevers, chills, fatigue.  HEENT:  No headaches, Difficulty swallowing,Tooth/dental problems,Sore throat,  No sneezing, itching, ear ache, nasal congestion, post nasal drip,  Cardio-vascular:  No chest pain, Orthopnea, PND, swelling in lower extremities, anasarca, dizziness, palpitations  GI:  No heartburn, indigestion, abdominal pain, nausea, vomiting, diarrhea, change in bowel habits, loss of appetite  Resp:  No shortness of breath with exertion or at rest. No excess mucus, no productive cough, No non-productive cough, No coughing up of blood.No change in color of mucus.No wheezing.No chest wall deformity  Skin:  no rash or lesions.  GU:  no dysuria, change in color of urine, no urgency or frequency. No flank pain.  Musculoskeletal:  No joint pain or swelling. No decreased range of motion. No back pain.  Psych:  No change in mood or affect. No depression or anxiety. No memory loss.   Past Medical History  Diagnosis Date  . Endometriosis   . Anemia    Past Surgical History  Procedure Laterality Date  . Laproscopy    . Tubal ligation     Social History:  reports that she has never smoked. She has never used smokeless tobacco. She reports that she drinks alcohol. She reports that she does not use illicit drugs.  No Known Allergies  Family History  Problem Relation Age of Onset  . Thyroid disease Neg  Hx   . Diabetes Father   . Hypertension Father   . Anemia Mother     Prior to Admission medications   Medication Sig Start Date End Date Taking? Authorizing Provider  acetaminophen (TYLENOL) 500 MG tablet Take 1,000 mg by mouth every  6 (six) hours as needed for moderate pain (pain).   Yes Historical Provider, MD  cephALEXin (KEFLEX) 500 MG capsule Take 1 capsule (500 mg total) by mouth 2 (two) times daily. 12/11/14  Yes Joe Truitt Leep, PA-C  ondansetron (ZOFRAN ODT) 4 MG disintegrating tablet 4mg  ODT q4 hours prn nausea/vomit 12/05/14  Yes Debby Freiberg, MD  oxyCODONE-acetaminophen (PERCOCET) 5-325 MG per tablet Take 1-2 tablets by mouth every 6 (six) hours as needed. 12/11/14  Yes Dahlia Bailiff, PA-C  ondansetron (ZOFRAN) 4 MG tablet Take 1 tablet (4 mg total) by mouth every 8 (eight) hours as needed for nausea or vomiting. 12/11/14   Dahlia Bailiff, PA-C   Physical Exam: Filed Vitals:   12/12/14 1228 12/12/14 1441 12/12/14 1647 12/12/14 1748  BP: 158/70 145/77 133/65 117/55  Pulse: 74 74 64 67  Temp: 98.4 F (36.9 C)  98.1 F (36.7 C) 98.4 F (36.9 C)  TempSrc: Oral  Oral Oral  Resp: 18 18 16 16   SpO2: 100% 100% 100% 100%    Wt Readings from Last 3 Encounters:  09/15/14 133.358 kg (294 lb)    General:  Well developed well nourished female laying on gurney with chills c/o abdominal pain.  Eyes: PERRLA, EOMI, normal lids, irises & conjunctiva ENT: grossly normal hearing, lips & tongue. Dry mucous membranes Neck: no LAD, masses or thyromegaly Cardiovascular: RRR, no m/r/g. No LE edema. Respiratory: CTA bilaterally, no w/r/r. Normal respiratory effort. Abdomen: soft, exquisitely tender to palpation in the suprapubic region, right lower quadrant, left lower quadrant.  Skin: no rash or induration seen on limited exam Musculoskeletal: grossly normal tone BUE/BLE Psychiatric: grossly normal mood and affect, speech fluent and appropriate Neurologic:  alert and oriented 3. Cranial 2 through 12 are grossly intact. No focal deficits.          Labs on Admission:  Basic Metabolic Panel:  Recent Labs Lab 12/10/14 1856 12/12/14 1357  NA 137 137  K 3.5 3.4*  CL 105 106  CO2 20 21  GLUCOSE 147* 124*  BUN 6 7  CREATININE 0.78  0.76  CALCIUM 9.4 9.0   Liver Function Tests:  Recent Labs Lab 12/10/14 1856 12/12/14 1357  AST 23 25  ALT 16 16  ALKPHOS 96 92  BILITOT 0.5 0.4  PROT 8.3 8.3  ALBUMIN 3.9 3.9    Recent Labs Lab 12/10/14 1856 12/12/14 1357  LIPASE 14 14   No results for input(s): AMMONIA in the last 168 hours. CBC:  Recent Labs Lab 12/10/14 1856 12/12/14 1357  WBC 8.0 9.6  NEUTROABS 6.8 8.2*  HGB 11.6* 11.8*  HCT 34.5* 35.3*  MCV 84.1 84.4  PLT 417* 464*   Cardiac Enzymes: No results for input(s): CKTOTAL, CKMB, CKMBINDEX, TROPONINI in the last 168 hours.  BNP (last 3 results) No results for input(s): BNP in the last 8760 hours.  ProBNP (last 3 results) No results for input(s): PROBNP in the last 8760 hours.  CBG: No results for input(s): GLUCAP in the last 168 hours.  Radiological Exams on Admission: No results found.  EKG: None  Assessment/Plan Principal Problem:   Ileitis Active Problems:   Abdominal pain   Nausea and vomiting   Hypokalemia   Anemia   #  1 probable terminal ileitis with surrounding mesenteric inflammation . CT of the abdomen and pelvis from 12/05/2014. Patient presenting with lower abdominal pain nausea vomiting and noted to have chills on physical exam. Patient was on Keflex prior to admission for probable UTI which was supposedly diagnosed in the last ED visit on 12/10/2014. Patient's white count is normal. Patient is currently afebrile. Will admit the patient to MedSurg floor. Will check blood cultures 2. Urinalysis is negative. Will check a chest x-ray. Will repeat CT of the abdomen and pelvis. Will place empirically on IV ciprofloxacin and IV Flagyl. Will consult with GI for further evaluation and management. ED PA stated she will call gastroenterology. Follow.  #2 abdominal pain/ nausea/ vomiting Likely secondary to problem #1. Will repeat CT of the abdomen and pelvis. Place on IV fluids. Antiemetics. Supportive care. Follow.  #3  hypokalemia Likely secondary to GI losses. Check a magnesium level. Replete.  #4 anemia Likely iron deficiency anemia in a menstruating female. Follow H&H.  #5 dehydration IV fluids.  #6 prophylaxis Lovenox for DVT prophylaxis.   Code Status: Full DVT Prophylaxis: Lovenox Family Communication: Updated patient and husband at bedside.  Disposition Plan: Admit to [facility].  Time spent: 74 minutes  Electa Sterry M.D. [facility] Pager [phone]

## 2014-12-12 NOTE — Progress Notes (Addendum)
ANTIBIOTIC CONSULT NOTE - INITIAL  Pharmacy Consult for zosyn Indication: intra-abdominal  No Known Allergies  Patient Measurements:   Adjusted Body Weight:   Vital Signs: Temp: 99.6 F (37.6 C) (03/11 1831) Temp Source: Oral (03/11 1831) BP: 138/87 mmHg (03/11 1831) Pulse Rate: 70 (03/11 1831) Intake/Output from previous day:   Intake/Output from this shift:    Labs:  Recent Labs  12/10/14 1856 12/12/14 1357  WBC 8.0 9.6  HGB 11.6* 11.8*  PLT 417* 464*  CREATININE 0.78 0.76   CrCl cannot be calculated (Unknown ideal weight.). No results for input(s): VANCOTROUGH, VANCOPEAK, VANCORANDOM, GENTTROUGH, GENTPEAK, GENTRANDOM, TOBRATROUGH, TOBRAPEAK, TOBRARND, AMIKACINPEAK, AMIKACINTROU, AMIKACIN in the last 72 hours.   Microbiology: Recent Results (from the past 720 hour(s))  Urine culture     Status: None   Collection Time: 12/10/14 10:00 PM  Result Value Ref Range Status   Specimen Description URINE, CLEAN CATCH  Final   Special Requests NONE  Final   Colony Count   Final    8,000 COLONIES/ML Performed at [facility]    Culture   Final    INSIGNIFICANT GROWTH Performed at [facility]    Report Status 12/12/2014 FINAL  Final    Medical History: Past Medical History  Diagnosis Date  . Endometriosis   . Anemia   . Fibroids 12/12/2014   Assessment: 1 YOF presents with N/V and abdominal pain.  CT scan reveals possible ileitis.  Originally start ciprofloxacin/flagyl but no doses given,  [facility] wishes to change to zosyn due to clinical picture  3/11 >> zosyn  >>  Tmax: 99.6 WBCs: WNL Renal: SCr WNL  3/11 blood:  Goal of Therapy:  Dose for patient-specific parameters and indication  Plan:   Zosyn 3.375gm IV q8h over 4h infusion  No further dosing adjustment needed at this time  Doreene Eland, PharmD, BCPS.   Pager: [phone] 12/12/2014,8:00 PM

## 2014-12-12 NOTE — Progress Notes (Signed)
Report received from ED 

## 2014-12-12 NOTE — ED Notes (Signed)
PA at bedside.

## 2014-12-12 NOTE — ED Provider Notes (Signed)
CSN: [id number]     Arrival date & time 12/12/14  1215 History   First MD Initiated Contact with Patient 12/12/14 1406     Chief Complaint  Patient presents with  . Abdominal Pain  . Emesis     (Consider location/radiation/quality/duration/timing/severity/associated sxs/prior Treatment) HPI Virginia Olson is a 39 y.o. female with hx of endometriosis and anemia, presents to ED complaining of diffuse abdominal pain, nausea, vomiting. Pt states symptoms started about a week ago. States was seen twice in ED for the same. Diagnosed with ileitis and possible UTI. Currently on keflex. States she is unable to keep anything down. Denies back or flank pain. Denies fever or chills. Denies vaginal discharge or bleeding. States pain is diffuse now, but started in RLQ. Had CT scan 5 days ago which showed ileitis. She states she has not had a chance to follow up with GI.   Past Medical History  Diagnosis Date  . Endometriosis   . Anemia    Past Surgical History  Procedure Laterality Date  . Laproscopy    . Tubal ligation     Family History  Problem Relation Age of Onset  . Thyroid disease Neg Hx   . Diabetes Father   . Hypertension Father    History  Substance Use Topics  . Smoking status: Never Smoker   . Smokeless tobacco: Never Used  . Alcohol Use: 0.0 oz/week    0 Standard drinks or equivalent per week     Comment: rarely   OB History    No data available     Review of Systems  Constitutional: Negative for fever and chills.  Respiratory: Negative for cough, chest tightness and shortness of breath.   Cardiovascular: Negative for chest pain, palpitations and leg swelling.  Gastrointestinal: Positive for nausea, vomiting and abdominal pain. Negative for diarrhea.  Genitourinary: Negative for dysuria, flank pain, vaginal bleeding, vaginal discharge, vaginal pain and pelvic pain.  Musculoskeletal: Negative for myalgias, arthralgias, neck pain and neck stiffness.  Skin: Negative for  rash.  Neurological: Negative for dizziness, weakness and headaches.  All other systems reviewed and are negative.     Allergies  Review of patient's allergies indicates no known allergies.  Home Medications   Prior to Admission medications   Medication Sig Start Date End Date Taking? Authorizing Provider  acetaminophen (TYLENOL) 500 MG tablet Take 1,000 mg by mouth every 6 (six) hours as needed for moderate pain (pain).   Yes Historical Provider, MD  cephALEXin (KEFLEX) 500 MG capsule Take 1 capsule (500 mg total) by mouth 2 (two) times daily. 12/11/14  Yes Joe Truitt Leep, PA-C  ondansetron (ZOFRAN ODT) 4 MG disintegrating tablet 4mg  ODT q4 hours prn nausea/vomit 12/05/14  Yes Debby Freiberg, MD  oxyCODONE-acetaminophen (PERCOCET) 5-325 MG per tablet Take 1-2 tablets by mouth every 6 (six) hours as needed. 12/11/14  Yes Joe Truitt Leep, PA-C  ondansetron (ZOFRAN) 4 MG tablet Take 1 tablet (4 mg total) by mouth every 8 (eight) hours as needed for nausea or vomiting. 12/11/14   Dahlia Bailiff, PA-C   BP 158/70 mmHg  Pulse 74  Temp(Src) 98.4 F (36.9 C) (Oral)  Resp 18  SpO2 100%  LMP 12/04/2014 Physical Exam  Constitutional: She appears well-developed and well-nourished.  Pt appears in a lot of pain  HENT:  Head: Normocephalic.  Eyes: Conjunctivae are normal.  Neck: Neck supple.  Cardiovascular: Normal rate, regular rhythm and normal heart sounds.   Pulmonary/Chest: Effort normal and breath sounds normal. No respiratory distress.  She has no wheezes. She has no rales.  Abdominal: Soft. Bowel sounds are normal. She exhibits no distension. There is tenderness. There is no rebound.  RLQ and diffuse tenderness  Musculoskeletal: She exhibits no edema.  Neurological: She is alert.  Skin: Skin is warm and dry.  Psychiatric: She has a normal mood and affect. Her behavior is normal.  Nursing note and vitals reviewed.   ED Course  Procedures (including critical care time) Labs Review Labs Reviewed   CBC WITH DIFFERENTIAL/PLATELET  COMPREHENSIVE METABOLIC PANEL  URINALYSIS, ROUTINE W REFLEX MICROSCOPIC  LIPASE, BLOOD  POC URINE PREG, ED    Imaging Review No results found.   EKG Interpretation None      MDM   Final diagnoses:  Ileitis  Intractable vomiting with nausea, vomiting of unspecified type    Patient with persistent abdominal pain, CT scan 5 days ago showing ileitis. Patient is having worsening symptoms since. This is her third visit to emergency department. Currently just treated symptomatically with Zofran at home. She appears to be very uncomfortable, vomiting, abdomen is tender mainly in the right lower quadrant. Will get repeat lab work, IV fluids started, to try Zofran and pain medications.  Patient symptoms are not improving after Phenergan, Zofran, Dilaudid. I think patient will need admission for further evaluation of her pain. I discussed with the [facility] hospitalist who asked for another CT of abdomen and pelvis. CT order placed. He also asked for a GI consult. Pt's VS are stable.   Pt feels better after another dose of pain medications. Vomiting stopped.   Patient signed out with PA Hess who will make the GI consult.    Jeannett Senior, PA-C 12/15/14 0113  Pamella Pert, MD 12/22/14 [phone]

## 2014-12-12 NOTE — ED Provider Notes (Signed)
Care assumed from [place], [place] at shift change. GI consult requested by admitting doctor. Awaiting GI consult to discuss ileitis seen on CT scan from yesterday. 5:15 PM I spoke with Dr. Watt Climes who will evaluate pt on admission.  Carman Ching, PA-C 12/12/14 1716  Charlesetta Shanks, MD 12/12/14 [phone]

## 2014-12-13 ENCOUNTER — Encounter (HOSPITAL_COMMUNITY): Payer: Self-pay | Admitting: Gastroenterology

## 2014-12-13 DIAGNOSIS — R109 Unspecified abdominal pain: Secondary | ICD-10-CM

## 2014-12-13 LAB — COMPREHENSIVE METABOLIC PANEL
ALT: 13 U/L (ref 0–35)
ANION GAP: 10 (ref 5–15)
AST: 15 U/L (ref 0–37)
Albumin: 3.2 g/dL — ABNORMAL LOW (ref 3.5–5.2)
Alkaline Phosphatase: 78 U/L (ref 39–117)
BILIRUBIN TOTAL: 0.4 mg/dL (ref 0.3–1.2)
BUN: 5 mg/dL — ABNORMAL LOW (ref 6–23)
CO2: 24 mmol/L (ref 19–32)
Calcium: 8.8 mg/dL (ref 8.4–10.5)
Chloride: 107 mmol/L (ref 96–112)
Creatinine, Ser: 0.81 mg/dL (ref 0.50–1.10)
GFR calc Af Amer: >90 mL/min (ref >90)
GLUCOSE: 92 mg/dL (ref 70–99)
Potassium: 3.3 mmol/L — ABNORMAL LOW (ref 3.5–5.1)
SODIUM: 141 mmol/L (ref 135–145)
Total Protein: 7 g/dL (ref 6.0–8.3)

## 2014-12-13 LAB — CBC WITH DIFFERENTIAL/PLATELET
Basophils Absolute: 0.1 10*3/uL (ref 0.0–0.1)
Basophils Relative: 1 % (ref 0–1)
EOS ABS: 0 10*3/uL (ref 0.0–0.7)
EOS PCT: 0 % (ref 0–5)
HCT: 31.9 % — ABNORMAL LOW (ref 36.0–46.0)
Hemoglobin: 10.5 g/dL — ABNORMAL LOW (ref 12.0–15.0)
LYMPHS ABS: 2.3 10*3/uL (ref 0.7–4.0)
Lymphocytes Relative: 22 % (ref 12–46)
MCH: 27.9 pg (ref 26.0–34.0)
MCHC: 32.9 g/dL (ref 30.0–36.0)
MCV: 84.8 fL (ref 78.0–100.0)
MONO ABS: 1.1 10*3/uL — AB (ref 0.1–1.0)
Monocytes Relative: 11 % (ref 3–12)
Neutro Abs: 6.8 10*3/uL (ref 1.7–7.7)
Neutrophils Relative %: 66 % (ref 43–77)
PLATELETS: 397 10*3/uL (ref 150–400)
RBC: 3.76 MIL/uL — AB (ref 3.87–5.11)
RDW: 14.5 % (ref 11.5–15.5)
WBC: 10.3 10*3/uL (ref 4.0–10.5)

## 2014-12-13 MED ORDER — POTASSIUM CHLORIDE CRYS ER 20 MEQ PO TBCR
40.0000 meq | EXTENDED_RELEASE_TABLET | Freq: Once | ORAL | Status: DC
  Filled 2014-12-13: qty 2

## 2014-12-13 MED ORDER — SODIUM CHLORIDE 0.9 % IV BOLUS (SEPSIS)
1000.0000 mL | Freq: Once | INTRAVENOUS | Status: AC
  Administered 2014-12-13: 1000 mL via INTRAVENOUS

## 2014-12-13 MED ORDER — ZOLPIDEM TARTRATE 5 MG PO TABS
5.0000 mg | ORAL_TABLET | Freq: Every evening | ORAL | Status: DC | PRN
  Administered 2014-12-13: 5 mg via ORAL
  Filled 2014-12-13: qty 1

## 2014-12-13 MED ORDER — SODIUM CHLORIDE 0.9 % IJ SOLN: 10.0000 mL | INTRAMUSCULAR | Status: DC | PRN

## 2014-12-13 MED ORDER — POTASSIUM CHLORIDE 10 MEQ/100ML IV SOLN
10.0000 meq | INTRAVENOUS | Status: AC
  Administered 2014-12-13 (×4): 10 meq via INTRAVENOUS
  Filled 2014-12-13 (×4): qty 100

## 2014-12-13 MED ORDER — MAGNESIUM SULFATE 4 GM/100ML IV SOLN
4.0000 g | Freq: Once | INTRAVENOUS | Status: AC
  Administered 2014-12-13: 4 g via INTRAVENOUS
  Filled 2014-12-13: qty 100

## 2014-12-13 NOTE — Progress Notes (Signed)
Patient had intermittent vomiting last night of bilious liquid. Had compazine at approximately 0113 with good effect. Continues on scheduled Zofran. Medicated times two for pain with good effect. Currently voicing no complaints of pain. Will continue to monitor for nausea/vomiting.

## 2014-12-13 NOTE — Consult Note (Signed)
Reason for Consult: Rule out Crohn's Referring Physician: Hospital team  Virginia Olson is an 39 y.o. female.  HPI: Patient seen and examined and discussed with surgical team Hospital team and interventional radiology as well as multiple family members and she has had some mild right lower quadrant pain for about 5 months usually worse with her periods but then in the last week she has had more pain and nausea and vomiting and has been to the emergency room 3 times and her labs and 2 CTs were reviewed and discussed with interventional radiology as above who could aspirate the fluid but not place a drain yet and on probiotics her bowel habits have been normal and she had a laparoscope years ago for endometriosis and a tubal ligation as well but no other abdominal surgeries and her family history is negative for any obvious GI problems or Crohn's disease and she has not been having any upper tract symptoms and she was not aware of having a fever although she may have had some periodic night sweats and chills recently and she has no urinary complaints and no skin eye  or joint complaints and is feeling better on pain medicine and has no other complaints  Past Medical History  Diagnosis Date  . Endometriosis   . Anemia   . Fibroids 12/12/2014    Past Surgical History  Procedure Laterality Date  . Laproscopy    . Tubal ligation      Family History  Problem Relation Age of Onset  . Thyroid disease Neg Hx   . Diabetes Father   . Hypertension Father   . Anemia Mother     Social History:  reports that she has never smoked. She has never used smokeless tobacco. She reports that she drinks alcohol. She reports that she does not use illicit drugs.  Allergies: No Known Allergies  Medications: I have reviewed the patient's current medications.  Results for orders placed or performed during the hospital encounter of 12/12/14 (from the past 48 hour(s))  CBC with Differential     Status: Abnormal    Collection Time: 12/12/14  1:57 PM  Result Value Ref Range   WBC 9.6 4.0 - 10.5 K/uL   RBC 4.18 3.87 - 5.11 MIL/uL   Hemoglobin 11.8 (L) 12.0 - 15.0 g/dL   HCT 35.3 (L) 36.0 - 46.0 %   MCV 84.4 78.0 - 100.0 fL   MCH 28.2 26.0 - 34.0 pg   MCHC 33.4 30.0 - 36.0 g/dL   RDW 14.4 11.5 - 15.5 %   Platelets 464 (H) 150 - 400 K/uL   Neutrophils Relative % 86 (H) 43 - 77 %   Lymphocytes Relative 11 (L) 12 - 46 %   Monocytes Relative 3 3 - 12 %   Eosinophils Relative 0 0 - 5 %   Basophils Relative 0 0 - 1 %   WBC Morphology VACUOLATED NEUTROPHILS     Comment: HYPERSEGMENTED NEUT   Smear Review LARGE PLATELETS PRESENT    Neutro Abs 8.2 (H) 1.7 - 7.7 K/uL   Lymphs Abs 1.1 0.7 - 4.0 K/uL   Monocytes Absolute 0.3 0.1 - 1.0 K/uL   Eosinophils Absolute 0.0 0.0 - 0.7 K/uL   Basophils Absolute 0.0 0.0 - 0.1 K/uL  Comprehensive metabolic panel     Status: Abnormal   Collection Time: 12/12/14  1:57 PM  Result Value Ref Range   Sodium 137 135 - 145 mmol/L   Potassium 3.4 (L) 3.5 - 5.1  mmol/L   Chloride 106 96 - 112 mmol/L   CO2 21 19 - 32 mmol/L   Glucose, Bld 124 (H) 70 - 99 mg/dL   BUN 7 6 - 23 mg/dL   Creatinine, Ser 0.76 0.50 - 1.10 mg/dL   Calcium 9.0 8.4 - 10.5 mg/dL   Total Protein 8.3 6.0 - 8.3 g/dL   Albumin 3.9 3.5 - 5.2 g/dL   AST 25 0 - 37 U/L   ALT 16 0 - 35 U/L   Alkaline Phosphatase 92 39 - 117 U/L   Total Bilirubin 0.4 0.3 - 1.2 mg/dL   GFR calc non Af Amer >90 >90 mL/min   GFR calc Af Amer >90 >90 mL/min    Comment: (NOTE) The eGFR has been calculated using the CKD EPI equation. This calculation has not been validated in all clinical situations. eGFR's persistently <90 mL/min signify possible Chronic Kidney Disease.    Anion gap 10 5 - 15  Lipase, blood     Status: None   Collection Time: 12/12/14  1:57 PM  Result Value Ref Range   Lipase 14 11 - 59 U/L  Urinalysis, Routine w reflex microscopic     Status: Abnormal   Collection Time: 12/12/14  2:54 PM  Result Value  Ref Range   Color, Urine YELLOW YELLOW   APPearance CLOUDY (A) CLEAR   Specific Gravity, Urine 1.031 (H) 1.005 - 1.030   pH 6.0 5.0 - 8.0   Glucose, UA NEGATIVE NEGATIVE mg/dL   Hgb urine dipstick SMALL (A) NEGATIVE   Bilirubin Urine NEGATIVE NEGATIVE   Ketones, ur >80 (A) NEGATIVE mg/dL   Protein, ur 30 (A) NEGATIVE mg/dL   Urobilinogen, UA 0.2 0.0 - 1.0 mg/dL   Nitrite NEGATIVE NEGATIVE   Leukocytes, UA NEGATIVE NEGATIVE  Urine microscopic-add on     Status: Abnormal   Collection Time: 12/12/14  2:54 PM  Result Value Ref Range   Squamous Epithelial / LPF FEW (A) RARE   WBC, UA 0-2 <3 WBC/hpf   RBC / HPF 3-6 <3 RBC/hpf   Bacteria, UA MANY (A) RARE   Urine-Other MUCOUS PRESENT   Magnesium     Status: None   Collection Time: 12/12/14  6:50 PM  Result Value Ref Range   Magnesium 1.6 1.5 - 2.5 mg/dL  Lactic acid, plasma     Status: None   Collection Time: 12/12/14  7:55 PM  Result Value Ref Range   Lactic Acid, Venous 0.8 0.5 - 2.0 mmol/L  Procalcitonin - Baseline     Status: None   Collection Time: 12/12/14  7:56 PM  Result Value Ref Range   Procalcitonin <0.10 ng/mL    Comment:        Interpretation: PCT (Procalcitonin) <= 0.5 ng/mL: Systemic infection (sepsis) is not likely. Local bacterial infection is possible. (NOTE)         ICU PCT Algorithm               Non ICU PCT Algorithm    ----------------------------     ------------------------------         PCT < 0.25 ng/mL                 PCT < 0.1 ng/mL     Stopping of antibiotics            Stopping of antibiotics       strongly encouraged.               strongly  encouraged.    ----------------------------     ------------------------------       PCT level decrease by               PCT < 0.25 ng/mL       >= 80% from peak PCT       OR PCT 0.25 - 0.5 ng/mL          Stopping of antibiotics                                             encouraged.     Stopping of antibiotics           encouraged.     ----------------------------     ------------------------------       PCT level decrease by              PCT >= 0.25 ng/mL       < 80% from peak PCT        AND PCT >= 0.5 ng/mL            Continuin g antibiotics                                              encouraged.       Continuing antibiotics            encouraged.    ----------------------------     ------------------------------     PCT level increase compared          PCT > 0.5 ng/mL         with peak PCT AND          PCT >= 0.5 ng/mL             Escalation of antibiotics                                          strongly encouraged.      Escalation of antibiotics        strongly encouraged.   Comprehensive metabolic panel     Status: Abnormal   Collection Time: 12/13/14  4:48 AM  Result Value Ref Range   Sodium 141 135 - 145 mmol/L   Potassium 3.3 (L) 3.5 - 5.1 mmol/L   Chloride 107 96 - 112 mmol/L   CO2 24 19 - 32 mmol/L   Glucose, Bld 92 70 - 99 mg/dL   BUN 5 (L) 6 - 23 mg/dL   Creatinine, Ser 0.81 0.50 - 1.10 mg/dL   Calcium 8.8 8.4 - 10.5 mg/dL   Total Protein 7.0 6.0 - 8.3 g/dL   Albumin 3.2 (L) 3.5 - 5.2 g/dL   AST 15 0 - 37 U/L   ALT 13 0 - 35 U/L   Alkaline Phosphatase 78 39 - 117 U/L   Total Bilirubin 0.4 0.3 - 1.2 mg/dL   GFR calc non Af Amer >90 >90 mL/min   GFR calc Af Amer >90 >90 mL/min    Comment: (NOTE) The eGFR has been calculated using the CKD EPI equation. This calculation has not been validated in all clinical situations. eGFR's persistently <90 mL/min signify possible  Chronic Kidney Disease.    Anion gap 10 5 - 15  CBC with Differential/Platelet     Status: Abnormal   Collection Time: 12/13/14  4:48 AM  Result Value Ref Range   WBC 10.3 4.0 - 10.5 K/uL   RBC 3.76 (L) 3.87 - 5.11 MIL/uL   Hemoglobin 10.5 (L) 12.0 - 15.0 g/dL   HCT 31.9 (L) 36.0 - 46.0 %   MCV 84.8 78.0 - 100.0 fL   MCH 27.9 26.0 - 34.0 pg   MCHC 32.9 30.0 - 36.0 g/dL   RDW 14.5 11.5 - 15.5 %   Platelets 397 150 - 400  K/uL   Neutrophils Relative % 66 43 - 77 %   Lymphocytes Relative 22 12 - 46 %   Monocytes Relative 11 3 - 12 %   Eosinophils Relative 0 0 - 5 %   Basophils Relative 1 0 - 1 %   Neutro Abs 6.8 1.7 - 7.7 K/uL   Lymphs Abs 2.3 0.7 - 4.0 K/uL   Monocytes Absolute 1.1 (H) 0.1 - 1.0 K/uL   Eosinophils Absolute 0.0 0.0 - 0.7 K/uL   Basophils Absolute 0.1 0.0 - 0.1 K/uL   Smear Review MORPHOLOGY UNREMARKABLE     Ct Abdomen Pelvis W Contrast  12/12/2014   CLINICAL DATA:  Acute onset of generalized abdominal pain, nausea and vomiting. Initial encounter.  EXAM: CT ABDOMEN AND PELVIS WITH CONTRAST  TECHNIQUE: Multidetector CT imaging of the abdomen and pelvis was performed using the standard protocol following bolus administration of intravenous contrast.  CONTRAST:  162m OMNIPAQUE IOHEXOL 300 MG/ML  SOLN  COMPARISON:  CT of the abdomen and pelvis performed 12/05/2014  FINDINGS: The visualized lung bases are clear.  The liver and spleen are unremarkable in appearance. The gallbladder is within normal limits. The pancreas and adrenal glands are unremarkable.  The kidneys are unremarkable in appearance. There is no evidence of hydronephrosis. No renal or ureteral stones are seen. No perinephric stranding is appreciated.  The small bowel is unremarkable in appearance. The stomach is within normal limits. No acute vascular abnormalities are seen.  There is a new vague collection of fluid measuring approximately 4.2 x 4.2 x 1.7 cm at the right lower quadrant adjacent to the terminal ileum and cecum, with minimal peripheral enhancement and surrounding phlegmon. This is suspicious for evolving abscess. Surrounding pericecal lymphadenopathy is seen. A few tiny foci of free air are seen medially, concerning for contained perforation.  The appendix is difficult to fully characterize, but appears grossly unremarkable, on comparison with the prior study, seen more inferiorly at the right lower quadrant. There is minimal  nonspecific haziness along the left paracolic gutter, new from the prior study. Mild inflammation is suggested along the ascending colon. Mild scattered diverticulosis is noted along the transverse and sigmoid colon.  The bladder is mildly distended and grossly unremarkable. A small urachal remnant is incidentally seen. The uterus is grossly unremarkable appearance. An intrauterine device is noted expected position at the fundus of the uterus. A 2.6 cm right adnexal cystic focus is likely physiologic in nature. The ovaries are otherwise unremarkable. No inguinal lymphadenopathy is seen.  No acute osseous abnormalities are identified.  IMPRESSION: 1. New vague 4.2 x 4.2 x 1.7 cm collection of fluid at the right lower quadrant, adjacent to the terminal ileum and cecum, with minimal peripheral enhancement and surrounding phlegmon. This is suspicious for evolving abscess. Few tiny foci of free air seen medially, concerning for contained perforation. Findings  raise concern for worsening terminal ileitis, possibly reflecting Crohn's disease or infectious ileitis. 2. Surrounding pericecal lymphadenopathy seen. 3. Mild inflammation suggested along the ascending colon. Minimal nonspecific haziness along the left paracolic gutter, new from the prior study. 4. Mild scattered diverticulosis along the transverse and sigmoid colon, without evidence of diverticulitis.  These results were called by telephone at the time of interpretation on 12/12/2014 at 6:25 pm to [facility] PA, who verbally acknowledged these results.   Electronically Signed   By: Garald Balding M.D.   On: 12/12/2014 18:27   Dg Chest Port 1 View  12/12/2014   CLINICAL DATA:  Abdominal pain and vomiting.  Syncope.  EXAM: PORTABLE CHEST - 1 VIEW  COMPARISON:  None.  FINDINGS: The heart size and mediastinal contours are within normal limits. Both lungs are clear. The visualized skeletal structures are unremarkable.  IMPRESSION: No active disease.   Electronically  Signed   By: Kerby Moors M.D.   On: 12/12/2014 18:16    ROS negative except above Blood pressure 103/56, pulse 57, temperature 98.3 F (36.8 C), temperature source Oral, resp. rate 18, weight 121.519 kg (267 lb 14.4 oz), last menstrual period 12/04/2014, SpO2 99 %. Physical Exam vital signs stable afebrile no acute distress exam pertinent for lungs are clear heart regular rate and rhythm abdomen is soft very minimal right lower quadrant discomfort no guarding or bleeding normal bowel sounds no pedal edema good peripheral pulses white count okay CT reviewed at as above  Assessment/Plan: Abnormal x-ray questionable Crohn's with very short segment involvement versus endometriosis with infection or bleeding versus atypical appendicitis or even atypical diverticulitis Plan: Agree with antibiotics and clear liquids would not use prednisone or steroids at this time could consider a 5-ASA-like  pentasa if we truly thought she did have Crohn's however I think if she does well I would probably repeat a scan in one week and decide further workup or drainage at that point and radiology did not feel that any other x-rays would be helpful at this time including small bowel follow-through or CT enterography and from a GI standpoint we could proceed with a colonoscopy in a month or 2 when it is safer from an inflammatory standpoint to proceed and we also discussed a possible capsule endoscopy however I would wait on decreasing inflammation as above since the capsule could get caught in the TI and require surgery and we will follow with you and please call me sooner if any question or problem this weekend [facility] E 12/13/2014, 12:04 PM

## 2014-12-13 NOTE — Progress Notes (Signed)
[facility] PROGRESS NOTE  Virginia Olson HWE:[id number] DOB: [birth year] DOA: 12/12/2014 PCP: Antony Blackbird, MD  Assessment/Plan: #1 ileitis with early probable fluid collection Patient presented with nausea vomiting chills right lower quadrant abdominal pain. Repeat CT of the abdomen with new vague 4.2 x 4.2 x 1.7 cm fluid collection in the right lower quadrant adjacent to the terminal lumen and cecum with surrounding phlegmon. Concern for evolving abscess. Pericecal lymphadenopathy. Mild inflammation along the descending colon. Mild scattered diverticulosis along the transverse and sigmoid colon. Patient has been pancultured. Continue empiric IV Zosyn. GI following. General surgery following. Will place on clear liquids for now. Follow.  #2 dehydration IV fluids.  #3 abdominal pain/nausea/vomiting Secondary to problem #1.  #4 hypokalemia Replete.  #5 anemia Anemia panel pending. Follow H&H.  #6 prophylaxis Lovenox for DVT prophylaxis.  Code Status: Full Family Communication: Updated patient and family at bedside. Disposition Plan: Remain inpatient.   Consultants:  GI: Dr. Watt Climes 12/13/2014  Gen. surgery: Dr. Marcello Moores 12/13/2014  Procedures:  CT abdomen and pelvis 12/12/2014  Chest x-ray 12/12/2014  Antibiotics:  IV Zosyn 12/12/2014  HPI/Subjective:  Patient states some improvement with her nausea on medications in pain is being controlled.  Objective: Filed Vitals:   12/13/14 0522  BP: 103/56  Pulse: 57  Temp: 98.3 F (36.8 C)  Resp: 18    Intake/Output Summary (Last 24 hours) at 12/13/14 1052 Last data filed at 12/13/14 0602  Gross per 24 hour  Intake  877.9 ml  Output    300 ml  Net  577.9 ml   Filed Weights   12/13/14 0522  Weight: 121.519 kg (267 lb 14.4 oz)    Exam:   General:  NAD  Cardiovascular: RRR  Respiratory: CTAB  Abdomen: Soft/ND/+TTP in RLQ  Musculoskeletal: No c/c/e  Data Reviewed: Basic Metabolic Panel:  Recent  Labs Lab 12/10/14 1856 12/12/14 1357 12/12/14 1850 12/13/14 0448  NA 137 137  --  141  K 3.5 3.4*  --  3.3*  CL 105 106  --  107  CO2 20 21  --  24  GLUCOSE 147* 124*  --  92  BUN 6 7  --  5*  CREATININE 0.78 0.76  --  0.81  CALCIUM 9.4 9.0  --  8.8  MG  --   --  1.6  --    Liver Function Tests:  Recent Labs Lab 12/10/14 1856 12/12/14 1357 12/13/14 0448  AST 23 25 15   ALT 16 16 13   ALKPHOS 96 92 78  BILITOT 0.5 0.4 0.4  PROT 8.3 8.3 7.0  ALBUMIN 3.9 3.9 3.2*    Recent Labs Lab 12/10/14 1856 12/12/14 1357  LIPASE 14 14   No results for input(s): AMMONIA in the last 168 hours. CBC:  Recent Labs Lab 12/10/14 1856 12/12/14 1357 12/13/14 0448  WBC 8.0 9.6 10.3  NEUTROABS 6.8 8.2* 6.8  HGB 11.6* 11.8* 10.5*  HCT 34.5* 35.3* 31.9*  MCV 84.1 84.4 84.8  PLT 417* 464* 397   Cardiac Enzymes: No results for input(s): CKTOTAL, CKMB, CKMBINDEX, TROPONINI in the last 168 hours. BNP (last 3 results) No results for input(s): BNP in the last 8760 hours.  ProBNP (last 3 results) No results for input(s): PROBNP in the last 8760 hours.  CBG: No results for input(s): GLUCAP in the last 168 hours.  Recent Results (from the past 240 hour(s))  Urine culture     Status: None   Collection Time: 12/10/14 10:00 PM  Result Value  Ref Range Status   Specimen Description URINE, CLEAN CATCH  Final   Special Requests NONE  Final   Colony Count   Final    8,000 COLONIES/ML Performed at [facility]    Culture   Final    INSIGNIFICANT GROWTH Performed at [facility]    Report Status 12/12/2014 FINAL  Final     Studies: Ct Abdomen Pelvis W Contrast  12/12/2014   CLINICAL DATA:  Acute onset of generalized abdominal pain, nausea and vomiting. Initial encounter.  EXAM: CT ABDOMEN AND PELVIS WITH CONTRAST  TECHNIQUE: Multidetector CT imaging of the abdomen and pelvis was performed using the standard protocol following bolus administration of intravenous  contrast.  CONTRAST:  139mL OMNIPAQUE IOHEXOL 300 MG/ML  SOLN  COMPARISON:  CT of the abdomen and pelvis performed 12/05/2014  FINDINGS: The visualized lung bases are clear.  The liver and spleen are unremarkable in appearance. The gallbladder is within normal limits. The pancreas and adrenal glands are unremarkable.  The kidneys are unremarkable in appearance. There is no evidence of hydronephrosis. No renal or ureteral stones are seen. No perinephric stranding is appreciated.  The small bowel is unremarkable in appearance. The stomach is within normal limits. No acute vascular abnormalities are seen.  There is a new vague collection of fluid measuring approximately 4.2 x 4.2 x 1.7 cm at the right lower quadrant adjacent to the terminal ileum and cecum, with minimal peripheral enhancement and surrounding phlegmon. This is suspicious for evolving abscess. Surrounding pericecal lymphadenopathy is seen. A few tiny foci of free air are seen medially, concerning for contained perforation.  The appendix is difficult to fully characterize, but appears grossly unremarkable, on comparison with the prior study, seen more inferiorly at the right lower quadrant. There is minimal nonspecific haziness along the left paracolic gutter, new from the prior study. Mild inflammation is suggested along the ascending colon. Mild scattered diverticulosis is noted along the transverse and sigmoid colon.  The bladder is mildly distended and grossly unremarkable. A small urachal remnant is incidentally seen. The uterus is grossly unremarkable appearance. An intrauterine device is noted expected position at the fundus of the uterus. A 2.6 cm right adnexal cystic focus is likely physiologic in nature. The ovaries are otherwise unremarkable. No inguinal lymphadenopathy is seen.  No acute osseous abnormalities are identified.  IMPRESSION: 1. New vague 4.2 x 4.2 x 1.7 cm collection of fluid at the right lower quadrant, adjacent to the terminal  ileum and cecum, with minimal peripheral enhancement and surrounding phlegmon. This is suspicious for evolving abscess. Few tiny foci of free air seen medially, concerning for contained perforation. Findings raise concern for worsening terminal ileitis, possibly reflecting Crohn's disease or infectious ileitis. 2. Surrounding pericecal lymphadenopathy seen. 3. Mild inflammation suggested along the ascending colon. Minimal nonspecific haziness along the left paracolic gutter, new from the prior study. 4. Mild scattered diverticulosis along the transverse and sigmoid colon, without evidence of diverticulitis.  These results were called by telephone at the time of interpretation on 12/12/2014 at 6:25 pm to [facility] PA, who verbally acknowledged these results.   Electronically Signed   By: Garald Balding M.D.   On: 12/12/2014 18:27   Dg Chest Port 1 View  12/12/2014   CLINICAL DATA:  Abdominal pain and vomiting.  Syncope.  EXAM: PORTABLE CHEST - 1 VIEW  COMPARISON:  None.  FINDINGS: The heart size and mediastinal contours are within normal limits. Both lungs are clear. The visualized skeletal  structures are unremarkable.  IMPRESSION: No active disease.   Electronically Signed   By: Kerby Moors M.D.   On: 12/12/2014 18:16    Scheduled Meds: . docusate sodium  100 mg Oral BID  . enoxaparin (LOVENOX) injection  65 mg Subcutaneous Q24H  . magnesium sulfate 1 - 4 g bolus IVPB  4 g Intravenous Once  . ondansetron (ZOFRAN) IV  4 mg Intravenous 4 times per day  . piperacillin-tazobactam (ZOSYN)  IV  3.375 g Intravenous 3 times per day  . potassium chloride  10 mEq Intravenous Q1 Hr x 4   Continuous Infusions: . sodium chloride 0.9 % 1,000 mL with potassium chloride 40 mEq infusion 125 mL/hr at 12/12/14 2022    Principal Problem:   Ileitis Active Problems:   Abdominal pain   Nausea and vomiting   Hypokalemia   Anemia   Fibroids    Time spent: 40 mins    [facility] MD [facility] Pager [phone]. If 7PM-7AM, please contact night-coverage at [email or web address], password [facility] 12/13/2014, 10:52 AM  LOS: 1 day

## 2014-12-13 NOTE — Progress Notes (Signed)
Peripherally Inserted Central Catheter/Midline Placement  The IV Nurse has discussed with the patient and/or persons authorized to consent for the patient, the purpose of this procedure and the potential benefits and risks involved with this procedure.  The benefits include less needle sticks, lab draws from the catheter and patient may be discharged home with the catheter.  Risks include, but not limited to, infection, bleeding, blood clot (thrombus formation), and puncture of an artery; nerve damage and irregular heat beat.  Alternatives to this procedure were also discussed.  PICC/Midline Placement Documentation  PICC / Midline Double Lumen 03/04/55 PICC Right Basilic 45 cm 0 cm (Active)  Indication for Insertion or Continuance of Line Limited venous access - need for IV therapy >5 days (PICC only);Prolonged intravenous therapies 12/13/2014  3:11 PM  Exposed Catheter (cm) 0 cm 12/13/2014  3:11 PM  Site Assessment Clean;Dry;Intact 12/13/2014  3:11 PM  Lumen #1 Status Flushed;Saline locked;Blood return noted 12/13/2014  3:11 PM  Lumen #2 Status Flushed;Saline locked;Blood return noted 12/13/2014  3:11 PM  Dressing Type Transparent 12/13/2014  3:11 PM  Dressing Status Clean;Dry;Intact;Antimicrobial disc in place 12/13/2014  3:11 PM  Line Care Connections checked and tightened 12/13/2014  3:11 PM  Line Adjustment (NICU/IV Team Only) No 12/13/2014  3:11 PM  Dressing Intervention New dressing 12/13/2014  3:11 PM  Dressing Change Due 12/20/14 12/13/2014  3:11 PM       Rolena Infante 12/13/2014, 3:12 PM

## 2014-12-13 NOTE — Consult Note (Signed)
Reason for Consult Terminal Ileitis  Referring Physician: Dr Virginia Olson Silos  Virginia Olson is an 39 y.o. female.  HPI: 39 y.o. F with ongoing abdominal pain for 5 months that is worse with her menstrual cycle.  She describes this as a sharp stabbing pain in her RLQ.  She has developed nausea and vomiting over the last few days.  She has been seen in the ED before and sent home with PO antibiotics but presented again with similar symptoms and inability to tolerate PO.    Past Medical History  Diagnosis Date  . Endometriosis   . Anemia   . Fibroids 12/12/2014    Past Surgical History  Procedure Laterality Date  . Laproscopy    . Tubal ligation      Family History  Problem Relation Age of Onset  . Thyroid disease Neg Hx   . Diabetes Father   . Hypertension Father   . Anemia Mother     Social History:  reports that she has never smoked. She has never used smokeless tobacco. She reports that she drinks alcohol. She reports that she does not use illicit drugs.  Allergies: No Known Allergies  Medications: I have reviewed the patient's current medications.  Results for orders placed or performed during the hospital encounter of 12/12/14 (from the past 48 hour(s))  CBC with Differential     Status: Abnormal   Collection Time: 12/12/14  1:57 PM  Result Value Ref Range   WBC 9.6 4.0 - 10.5 K/uL   RBC 4.18 3.87 - 5.11 MIL/uL   Hemoglobin 11.8 (L) 12.0 - 15.0 g/dL   HCT 35.3 (L) 36.0 - 46.0 %   MCV 84.4 78.0 - 100.0 fL   MCH 28.2 26.0 - 34.0 pg   MCHC 33.4 30.0 - 36.0 g/dL   RDW 14.4 11.5 - 15.5 %   Platelets 464 (H) 150 - 400 K/uL   Neutrophils Relative % 86 (H) 43 - 77 %   Lymphocytes Relative 11 (L) 12 - 46 %   Monocytes Relative 3 3 - 12 %   Eosinophils Relative 0 0 - 5 %   Basophils Relative 0 0 - 1 %   WBC Morphology VACUOLATED NEUTROPHILS     Comment: HYPERSEGMENTED NEUT   Smear Review LARGE PLATELETS PRESENT    Neutro Abs 8.2 (H) 1.7 - 7.7 K/uL   Lymphs Abs 1.1 0.7 -  4.0 K/uL   Monocytes Absolute 0.3 0.1 - 1.0 K/uL   Eosinophils Absolute 0.0 0.0 - 0.7 K/uL   Basophils Absolute 0.0 0.0 - 0.1 K/uL  Comprehensive metabolic panel     Status: Abnormal   Collection Time: 12/12/14  1:57 PM  Result Value Ref Range   Sodium 137 135 - 145 mmol/L   Potassium 3.4 (L) 3.5 - 5.1 mmol/L   Chloride 106 96 - 112 mmol/L   CO2 21 19 - 32 mmol/L   Glucose, Bld 124 (H) 70 - 99 mg/dL   BUN 7 6 - 23 mg/dL   Creatinine, Ser 0.76 0.50 - 1.10 mg/dL   Calcium 9.0 8.4 - 10.5 mg/dL   Total Protein 8.3 6.0 - 8.3 g/dL   Albumin 3.9 3.5 - 5.2 g/dL   AST 25 0 - 37 U/L   ALT 16 0 - 35 U/L   Alkaline Phosphatase 92 39 - 117 U/L   Total Bilirubin 0.4 0.3 - 1.2 mg/dL   GFR calc non Af Amer >90 >90 mL/min   GFR calc Af Amer >  90 >90 mL/min    Comment: (NOTE) The eGFR has been calculated using the CKD EPI equation. This calculation has not been validated in all clinical situations. eGFR's persistently <90 mL/min signify possible Chronic Kidney Disease.    Anion gap 10 5 - 15  Lipase, blood     Status: None   Collection Time: 12/12/14  1:57 PM  Result Value Ref Range   Lipase 14 11 - 59 U/L  Urinalysis, Routine w reflex microscopic     Status: Abnormal   Collection Time: 12/12/14  2:54 PM  Result Value Ref Range   Color, Urine YELLOW YELLOW   APPearance CLOUDY (A) CLEAR   Specific Gravity, Urine 1.031 (H) 1.005 - 1.030   pH 6.0 5.0 - 8.0   Glucose, UA NEGATIVE NEGATIVE mg/dL   Hgb urine dipstick SMALL (A) NEGATIVE   Bilirubin Urine NEGATIVE NEGATIVE   Ketones, ur >80 (A) NEGATIVE mg/dL   Protein, ur 30 (A) NEGATIVE mg/dL   Urobilinogen, UA 0.2 0.0 - 1.0 mg/dL   Nitrite NEGATIVE NEGATIVE   Leukocytes, UA NEGATIVE NEGATIVE  Urine microscopic-add on     Status: Abnormal   Collection Time: 12/12/14  2:54 PM  Result Value Ref Range   Squamous Epithelial / LPF FEW (A) RARE   WBC, UA 0-2 <3 WBC/hpf   RBC / HPF 3-6 <3 RBC/hpf   Bacteria, UA MANY (A) RARE   Urine-Other  MUCOUS PRESENT   Magnesium     Status: None   Collection Time: 12/12/14  6:50 PM  Result Value Ref Range   Magnesium 1.6 1.5 - 2.5 mg/dL  Lactic acid, plasma     Status: None   Collection Time: 12/12/14  7:55 PM  Result Value Ref Range   Lactic Acid, Venous 0.8 0.5 - 2.0 mmol/L  Procalcitonin - Baseline     Status: None   Collection Time: 12/12/14  7:56 PM  Result Value Ref Range   Procalcitonin <0.10 ng/mL    Comment:        Interpretation: PCT (Procalcitonin) <= 0.5 ng/mL: Systemic infection (sepsis) is not likely. Local bacterial infection is possible. (NOTE)         ICU PCT Algorithm               Non ICU PCT Algorithm    ----------------------------     ------------------------------         PCT < 0.25 ng/mL                 PCT < 0.1 ng/mL     Stopping of antibiotics            Stopping of antibiotics       strongly encouraged.               strongly encouraged.    ----------------------------     ------------------------------       PCT level decrease by               PCT < 0.25 ng/mL       >= 80% from peak PCT       OR PCT 0.25 - 0.5 ng/mL          Stopping of antibiotics                                             encouraged.  Stopping of antibiotics           encouraged.    ----------------------------     ------------------------------       PCT level decrease by              PCT >= 0.25 ng/mL       < 80% from peak PCT        AND PCT >= 0.5 ng/mL            Continuin g antibiotics                                              encouraged.       Continuing antibiotics            encouraged.    ----------------------------     ------------------------------     PCT level increase compared          PCT > 0.5 ng/mL         with peak PCT AND          PCT >= 0.5 ng/mL             Escalation of antibiotics                                          strongly encouraged.      Escalation of antibiotics        strongly encouraged.   Comprehensive metabolic panel      Status: Abnormal   Collection Time: 12/13/14  4:48 AM  Result Value Ref Range   Sodium 141 135 - 145 mmol/L   Potassium 3.3 (L) 3.5 - 5.1 mmol/L   Chloride 107 96 - 112 mmol/L   CO2 24 19 - 32 mmol/L   Glucose, Bld 92 70 - 99 mg/dL   BUN 5 (L) 6 - 23 mg/dL   Creatinine, Ser 0.81 0.50 - 1.10 mg/dL   Calcium 8.8 8.4 - 10.5 mg/dL   Total Protein 7.0 6.0 - 8.3 g/dL   Albumin 3.2 (L) 3.5 - 5.2 g/dL   AST 15 0 - 37 U/L   ALT 13 0 - 35 U/L   Alkaline Phosphatase 78 39 - 117 U/L   Total Bilirubin 0.4 0.3 - 1.2 mg/dL   GFR calc non Af Amer >90 >90 mL/min   GFR calc Af Amer >90 >90 mL/min    Comment: (NOTE) The eGFR has been calculated using the CKD EPI equation. This calculation has not been validated in all clinical situations. eGFR's persistently <90 mL/min signify possible Chronic Kidney Disease.    Anion gap 10 5 - 15  CBC with Differential/Platelet     Status: Abnormal   Collection Time: 12/13/14  4:48 AM  Result Value Ref Range   WBC 10.3 4.0 - 10.5 K/uL   RBC 3.76 (L) 3.87 - 5.11 MIL/uL   Hemoglobin 10.5 (L) 12.0 - 15.0 g/dL   HCT 31.9 (L) 36.0 - 46.0 %   MCV 84.8 78.0 - 100.0 fL   MCH 27.9 26.0 - 34.0 pg   MCHC 32.9 30.0 - 36.0 g/dL   RDW 14.5 11.5 - 15.5 %   Platelets 397 150 - 400 K/uL   Neutrophils Relative % 66 43 - 77 %  Lymphocytes Relative 22 12 - 46 %   Monocytes Relative 11 3 - 12 %   Eosinophils Relative 0 0 - 5 %   Basophils Relative 1 0 - 1 %   Neutro Abs 6.8 1.7 - 7.7 K/uL   Lymphs Abs 2.3 0.7 - 4.0 K/uL   Monocytes Absolute 1.1 (H) 0.1 - 1.0 K/uL   Eosinophils Absolute 0.0 0.0 - 0.7 K/uL   Basophils Absolute 0.1 0.0 - 0.1 K/uL   Smear Review MORPHOLOGY UNREMARKABLE     Ct Abdomen Pelvis W Contrast  12/12/2014   CLINICAL DATA:  Acute onset of generalized abdominal pain, nausea and vomiting. Initial encounter.  EXAM: CT ABDOMEN AND PELVIS WITH CONTRAST  TECHNIQUE: Multidetector CT imaging of the abdomen and pelvis was performed using the standard  protocol following bolus administration of intravenous contrast.  CONTRAST:  142m OMNIPAQUE IOHEXOL 300 MG/ML  SOLN  COMPARISON:  CT of the abdomen and pelvis performed 12/05/2014  FINDINGS: The visualized lung bases are clear.  The liver and spleen are unremarkable in appearance. The gallbladder is within normal limits. The pancreas and adrenal glands are unremarkable.  The kidneys are unremarkable in appearance. There is no evidence of hydronephrosis. No renal or ureteral stones are seen. No perinephric stranding is appreciated.  The small bowel is unremarkable in appearance. The stomach is within normal limits. No acute vascular abnormalities are seen.  There is a new vague collection of fluid measuring approximately 4.2 x 4.2 x 1.7 cm at the right lower quadrant adjacent to the terminal ileum and cecum, with minimal peripheral enhancement and surrounding phlegmon. This is suspicious for evolving abscess. Surrounding pericecal lymphadenopathy is seen. A few tiny foci of free air are seen medially, concerning for contained perforation.  The appendix is difficult to fully characterize, but appears grossly unremarkable, on comparison with the prior study, seen more inferiorly at the right lower quadrant. There is minimal nonspecific haziness along the left paracolic gutter, new from the prior study. Mild inflammation is suggested along the ascending colon. Mild scattered diverticulosis is noted along the transverse and sigmoid colon.  The bladder is mildly distended and grossly unremarkable. A small urachal remnant is incidentally seen. The uterus is grossly unremarkable appearance. An intrauterine device is noted expected position at the fundus of the uterus. A 2.6 cm right adnexal cystic focus is likely physiologic in nature. The ovaries are otherwise unremarkable. No inguinal lymphadenopathy is seen.  No acute osseous abnormalities are identified.  IMPRESSION: 1. New vague 4.2 x 4.2 x 1.7 cm collection of fluid  at the right lower quadrant, adjacent to the terminal ileum and cecum, with minimal peripheral enhancement and surrounding phlegmon. This is suspicious for evolving abscess. Few tiny foci of free air seen medially, concerning for contained perforation. Findings raise concern for worsening terminal ileitis, possibly reflecting Crohn's disease or infectious ileitis. 2. Surrounding pericecal lymphadenopathy seen. 3. Mild inflammation suggested along the ascending colon. Minimal nonspecific haziness along the left paracolic gutter, new from the prior study. 4. Mild scattered diverticulosis along the transverse and sigmoid colon, without evidence of diverticulitis.  These results were called by telephone at the time of interpretation on 12/12/2014 at 6:25 pm to [facility], who verbally acknowledged these results.   Electronically Signed   By: JGarald BaldingM.D.   On: 12/12/2014 18:27   Dg Chest Port 1 View  12/12/2014   CLINICAL DATA:  Abdominal pain and vomiting.  Syncope.  EXAM: PORTABLE CHEST - 1 VIEW  COMPARISON:  None.  FINDINGS: The heart size and mediastinal contours are within normal limits. Both lungs are clear. The visualized skeletal structures are unremarkable.  IMPRESSION: No active disease.   Electronically Signed   By: Kerby Moors M.D.   On: 12/12/2014 18:16    Review of Systems  Constitutional: Negative for fever and chills.  Respiratory: Negative for cough and shortness of breath.   Cardiovascular: Negative for chest pain and leg swelling.  Gastrointestinal: Positive for nausea, vomiting and abdominal pain. Negative for diarrhea, constipation, blood in stool and melena.  Genitourinary: Negative for dysuria, urgency and frequency.  Musculoskeletal: Negative for myalgias.  Skin: Negative for itching and rash.  Neurological: Negative for dizziness and headaches.   Blood pressure 103/56, pulse 57, temperature 98.3 F (36.8 C), temperature source Oral, resp. rate 18, weight 267 lb 14.4  oz (121.519 kg), last menstrual period 12/04/2014, SpO2 99 %. Physical Exam  Constitutional: She is oriented to person, place, and time. She appears well-developed and well-nourished.  HENT:  Head: Normocephalic and atraumatic.  Eyes: Conjunctivae are normal. Pupils are equal, round, and reactive to light.  Neck: Normal range of motion. Neck supple.  Cardiovascular: Normal rate and regular rhythm.   Respiratory: Effort normal and breath sounds normal.  GI: Soft. She exhibits no mass. There is tenderness. There is no rebound and no guarding.  RLQ  Musculoskeletal: Normal range of motion.  Neurological: She is alert and oriented to person, place, and time.  Skin: Skin is warm and dry.    Assessment/Plan: Pt with terminal ileitis and surrounding phlegmon and lymphadenopathy.  Infectious vs inflammatory bowel disease would be most likely cause.  Agree with broad spectrum antibiotics and NPO.  Rec GI consult as well.  Would probably repeat CT on Mon and eval for abscess formation that may need to be drained.    Akhil Piscopo C. 1/50/5697, 94:80 AM

## 2014-12-14 DIAGNOSIS — R1031 Right lower quadrant pain: Secondary | ICD-10-CM

## 2014-12-14 LAB — CBC
HEMATOCRIT: 33.5 % — AB (ref 36.0–46.0)
Hemoglobin: 11 g/dL — ABNORMAL LOW (ref 12.0–15.0)
MCH: 27.8 pg (ref 26.0–34.0)
MCHC: 32.8 g/dL (ref 30.0–36.0)
MCV: 84.8 fL (ref 78.0–100.0)
Platelets: 385 10*3/uL (ref 150–400)
RBC: 3.95 MIL/uL (ref 3.87–5.11)
RDW: 14.6 % (ref 11.5–15.5)
WBC: 12.4 10*3/uL — AB (ref 4.0–10.5)

## 2014-12-14 LAB — BASIC METABOLIC PANEL
Anion gap: 10 (ref 5–15)
CO2: 23 mmol/L (ref 19–32)
CREATININE: 0.71 mg/dL (ref 0.50–1.10)
Calcium: 8.5 mg/dL (ref 8.4–10.5)
Chloride: 102 mmol/L (ref 96–112)
GFR calc Af Amer: >90 mL/min (ref >90)
Glucose, Bld: 115 mg/dL — ABNORMAL HIGH (ref 70–99)
Potassium: 4 mmol/L (ref 3.5–5.1)
Sodium: 135 mmol/L (ref 135–145)

## 2014-12-14 LAB — MAGNESIUM: Magnesium: 2.1 mg/dL (ref 1.5–2.5)

## 2014-12-14 LAB — PROCALCITONIN

## 2014-12-14 MED ORDER — MORPHINE SULFATE 4 MG/ML IJ SOLN
4.0000 mg | INTRAMUSCULAR | Status: DC | PRN
  Administered 2014-12-14 – 2014-12-16 (×4): 4 mg via INTRAVENOUS
  Filled 2014-12-14 (×4): qty 1

## 2014-12-14 MED ORDER — TRAZODONE HCL 50 MG PO TABS: 100.0000 mg | ORAL_TABLET | Freq: Every evening | ORAL | Status: DC | PRN

## 2014-12-14 MED ORDER — MORPHINE SULFATE 2 MG/ML IJ SOLN: 2.0000 mg | Freq: Once | INTRAMUSCULAR | Status: AC

## 2014-12-14 MED ORDER — PROMETHAZINE HCL 25 MG/ML IJ SOLN
25.0000 mg | Freq: Four times a day (QID) | INTRAMUSCULAR | Status: AC
  Administered 2014-12-14 – 2014-12-16 (×8): 25 mg via INTRAVENOUS
  Filled 2014-12-14 (×11): qty 1

## 2014-12-14 MED ORDER — LORAZEPAM 2 MG/ML IJ SOLN: 1.0000 mg | Freq: Every evening | INTRAMUSCULAR | Status: DC | PRN

## 2014-12-14 MED ORDER — ENOXAPARIN SODIUM 60 MG/0.6ML ~~LOC~~ SOLN
60.0000 mg | SUBCUTANEOUS | Status: DC
  Administered 2014-12-14 – 2014-12-17 (×4): 60 mg via SUBCUTANEOUS
  Filled 2014-12-14 (×5): qty 0.6

## 2014-12-14 NOTE — Progress Notes (Signed)
[facility] PROGRESS NOTE  Virginia Olson YBF:[id number] DOB: Jun 02, 1976 DOA: 12/12/2014 PCP: Antony Blackbird, MD  Assessment/Plan: #1 ileitis with early probable fluid collection Patient presented with nausea vomiting chills right lower quadrant abdominal pain. Repeat CT of the abdomen with new vague 4.2 x 4.2 x 1.7 cm fluid collection in the right lower quadrant adjacent to the terminal lumen and cecum with surrounding phlegmon. Concern for evolving abscess. Pericecal lymphadenopathy. Mild inflammation along the descending colon. Mild scattered diverticulosis along the transverse and sigmoid colon. Patient has been pancultured. Patient complaining of nausea, and worsened abdominal pain. Patient with increasing leukocytosis. Continue empiric IV Zosyn. Per general surgery may need repeat CT scan tomorrow. GI following. General surgery following. Follow.  #2 dehydration IV fluids.  #3 right lower quadrant abdominal pain/nausea/vomiting Secondary to problem #1. Will change scheduled Zofran to scheduled Phenergan for the next 2-3 days. Will increase morphine to 4 mg every 2 hours when necessary pain.  #4 hypokalemia Repleted.  #5 anemia Anemia panel pending. H&H stable. Follow H&H.  #6 prophylaxis Lovenox for DVT prophylaxis.  Code Status: Full Family Communication: Updated patient and family at bedside. Disposition Plan: Remain inpatient.   Consultants:  GI: Dr. Watt Climes 12/13/2014  Gen. surgery: Dr. Marcello Moores 12/13/2014  Procedures:  CT abdomen and pelvis 12/12/2014  Chest x-ray 12/12/2014  Antibiotics:  IV Zosyn 12/12/2014  HPI/Subjective:  Patient complaining of nausea and worsening abdominal pain. Patient states she's not feeling good today.    Objective: Filed Vitals:   12/14/14 0520  BP: 139/68  Pulse: 72  Temp: 99 F (37.2 C)  Resp: 16    Intake/Output Summary (Last 24 hours) at 12/14/14 0944 Last data filed at 12/14/14 0539  Gross per 24 hour  Intake  3369.43 ml  Output    300 ml  Net 3069.43 ml   Filed Weights   12/13/14 0522  Weight: 121.519 kg (267 lb 14.4 oz)    Exam:   General:  NAD  Cardiovascular: RRR  Respiratory: CTAB  Abdomen: Soft/ND/+TTP in RLQ  Musculoskeletal: No c/c/e  Data Reviewed: Basic Metabolic Panel:  Recent Labs Lab 12/10/14 1856 12/12/14 1357 12/12/14 1850 12/13/14 0448 12/14/14 0530  NA 137 137  --  141 135  K 3.5 3.4*  --  3.3* 4.0  CL 105 106  --  107 102  CO2 20 21  --  24 23  GLUCOSE 147* 124*  --  92 115*  BUN 6 7  --  5* <5*  CREATININE 0.78 0.76  --  0.81 0.71  CALCIUM 9.4 9.0  --  8.8 8.5  MG  --   --  1.6  --  2.1   Liver Function Tests:  Recent Labs Lab 12/10/14 1856 12/12/14 1357 12/13/14 0448  AST 23 25 15   ALT 16 16 13   ALKPHOS 96 92 78  BILITOT 0.5 0.4 0.4  PROT 8.3 8.3 7.0  ALBUMIN 3.9 3.9 3.2*    Recent Labs Lab 12/10/14 1856 12/12/14 1357  LIPASE 14 14   No results for input(s): AMMONIA in the last 168 hours. CBC:  Recent Labs Lab 12/10/14 1856 12/12/14 1357 12/13/14 0448 12/14/14 0530  WBC 8.0 9.6 10.3 12.4*  NEUTROABS 6.8 8.2* 6.8  --   HGB 11.6* 11.8* 10.5* 11.0*  HCT 34.5* 35.3* 31.9* 33.5*  MCV 84.1 84.4 84.8 84.8  PLT 417* 464* 397 385   Cardiac Enzymes: No results for input(s): CKTOTAL, CKMB, CKMBINDEX, TROPONINI in the last 168 hours. BNP (last 3  results) No results for input(s): BNP in the last 8760 hours.  ProBNP (last 3 results) No results for input(s): PROBNP in the last 8760 hours.  CBG: No results for input(s): GLUCAP in the last 168 hours.  Recent Results (from the past 240 hour(s))  Urine culture     Status: None   Collection Time: 12/10/14 10:00 PM  Result Value Ref Range Status   Specimen Description URINE, CLEAN CATCH  Final   Special Requests NONE  Final   Colony Count   Final    8,000 COLONIES/ML Performed at [facility]    Culture   Final    INSIGNIFICANT GROWTH Performed at [facility]    Report Status 12/12/2014 FINAL  Final  Culture, blood (routine x 2)     Status: None (Preliminary result)   Collection Time: 12/12/14  6:50 PM  Result Value Ref Range Status   Specimen Description BLOOD RIGHT HAND  10 ML IN [facility] ONLY  Final   Special Requests NONE  Final   Culture   Final           BLOOD CULTURE RECEIVED NO GROWTH TO DATE CULTURE WILL BE HELD FOR 5 DAYS BEFORE ISSUING A FINAL NEGATIVE REPORT Performed at [facility]    Report Status PENDING  Incomplete  Culture, blood (routine x 2)     Status: None (Preliminary result)   Collection Time: 12/12/14  7:00 PM  Result Value Ref Range Status   Specimen Description BLOOD RIGHT ARM  10 ML IN [facility] BOTTLE  Final   Special Requests NONE  Final   Culture   Final           BLOOD CULTURE RECEIVED NO GROWTH TO DATE CULTURE WILL BE HELD FOR 5 DAYS BEFORE ISSUING A FINAL NEGATIVE REPORT Performed at [facility]    Report Status PENDING  Incomplete     Studies: Ct Abdomen Pelvis W Contrast  12/12/2014   CLINICAL DATA:  Acute onset of generalized abdominal pain, nausea and vomiting. Initial encounter.  EXAM: CT ABDOMEN AND PELVIS WITH CONTRAST  TECHNIQUE: Multidetector CT imaging of the abdomen and pelvis was performed using the standard protocol following bolus administration of intravenous contrast.  CONTRAST:  11mL OMNIPAQUE IOHEXOL 300 MG/ML  SOLN  COMPARISON:  CT of the abdomen and pelvis performed 12/05/2014  FINDINGS: The visualized lung bases are clear.  The liver and spleen are unremarkable in appearance. The gallbladder is within normal limits. The pancreas and adrenal glands are unremarkable.  The kidneys are unremarkable in appearance. There is no evidence of hydronephrosis. No renal or ureteral stones are seen. No perinephric stranding is appreciated.  The small bowel is unremarkable in appearance. The stomach is within normal limits. No acute vascular abnormalities are seen.  There is a new  vague collection of fluid measuring approximately 4.2 x 4.2 x 1.7 cm at the right lower quadrant adjacent to the terminal ileum and cecum, with minimal peripheral enhancement and surrounding phlegmon. This is suspicious for evolving abscess. Surrounding pericecal lymphadenopathy is seen. A few tiny foci of free air are seen medially, concerning for contained perforation.  The appendix is difficult to fully characterize, but appears grossly unremarkable, on comparison with the prior study, seen more inferiorly at the right lower quadrant. There is minimal nonspecific haziness along the left paracolic gutter, new from the prior study. Mild inflammation is suggested along the ascending colon. Mild scattered diverticulosis is noted along the transverse and  sigmoid colon.  The bladder is mildly distended and grossly unremarkable. A small urachal remnant is incidentally seen. The uterus is grossly unremarkable appearance. An intrauterine device is noted expected position at the fundus of the uterus. A 2.6 cm right adnexal cystic focus is likely physiologic in nature. The ovaries are otherwise unremarkable. No inguinal lymphadenopathy is seen.  No acute osseous abnormalities are identified.  IMPRESSION: 1. New vague 4.2 x 4.2 x 1.7 cm collection of fluid at the right lower quadrant, adjacent to the terminal ileum and cecum, with minimal peripheral enhancement and surrounding phlegmon. This is suspicious for evolving abscess. Few tiny foci of free air seen medially, concerning for contained perforation. Findings raise concern for worsening terminal ileitis, possibly reflecting Crohn's disease or infectious ileitis. 2. Surrounding pericecal lymphadenopathy seen. 3. Mild inflammation suggested along the ascending colon. Minimal nonspecific haziness along the left paracolic gutter, new from the prior study. 4. Mild scattered diverticulosis along the transverse and sigmoid colon, without evidence of diverticulitis.  These  results were called by telephone at the time of interpretation on 12/12/2014 at 6:25 pm to [facility] PA, who verbally acknowledged these results.   Electronically Signed   By: Garald Balding M.D.   On: 12/12/2014 18:27   Dg Chest Port 1 View  12/12/2014   CLINICAL DATA:  Abdominal pain and vomiting.  Syncope.  EXAM: PORTABLE CHEST - 1 VIEW  COMPARISON:  None.  FINDINGS: The heart size and mediastinal contours are within normal limits. Both lungs are clear. The visualized skeletal structures are unremarkable.  IMPRESSION: No active disease.   Electronically Signed   By: Kerby Moors M.D.   On: 12/12/2014 18:16    Scheduled Meds: . docusate sodium  100 mg Oral BID  . enoxaparin (LOVENOX) injection  60 mg Subcutaneous Q24H  . ondansetron (ZOFRAN) IV  4 mg Intravenous 4 times per day  . piperacillin-tazobactam (ZOSYN)  IV  3.375 g Intravenous 3 times per day   Continuous Infusions: . sodium chloride 0.9 % 1,000 mL with potassium chloride 40 mEq infusion 75 mL/hr at 12/14/14 0802    Principal Problem:   Ileitis Active Problems:   Abdominal pain   Nausea and vomiting   Hypokalemia   Anemia   Fibroids    Time spent: 40 mins    [facility] MD [facility] Pager [phone]. If 7PM-7AM, please contact night-coverage at [email or web address], password [facility] 12/14/2014, 9:44 AM  LOS: 2 days

## 2014-12-14 NOTE — Progress Notes (Signed)
Patient ID: Virginia Olson, female   DOB: 1976/01/01, 39 y.o.   MRN: [id number] [facility] Surgery Progress Note:   * No surgery found *  Subjective: Mental status is clear.  She is complaining more of abdominal pain.   Objective: Vital signs in last 24 hours: Temp:  [97.9 F (36.6 C)-99.6 F (37.6 C)] 99 F (37.2 C) (03/13 0520) Pulse Rate:  [68-74] 72 (03/13 0520) Resp:  [16-18] 16 (03/13 0520) BP: (121-139)/(60-69) 139/68 mmHg (03/13 0520) SpO2:  [98 %-100 %] 99 % (03/13 0520)  Intake/Output from previous day: 03/12 0701 - 03/13 0700 In: 3369.4 [I.V.:2869.8; IV Piggyback:499.6] Out: 600 [Urine:600] Intake/Output this shift:    Physical Exam: Work of breathing is normal.  More tender in the right lower quadrant.    Lab Results:  Results for orders placed or performed during the hospital encounter of 12/12/14 (from the past 48 hour(s))  CBC with Differential     Status: Abnormal   Collection Time: 12/12/14  1:57 PM  Result Value Ref Range   WBC 9.6 4.0 - 10.5 K/uL   RBC 4.18 3.87 - 5.11 MIL/uL   Hemoglobin 11.8 (L) 12.0 - 15.0 g/dL   HCT 35.3 (L) 36.0 - 46.0 %   MCV 84.4 78.0 - 100.0 fL   MCH 28.2 26.0 - 34.0 pg   MCHC 33.4 30.0 - 36.0 g/dL   RDW 14.4 11.5 - 15.5 %   Platelets 464 (H) 150 - 400 K/uL   Neutrophils Relative % 86 (H) 43 - 77 %   Lymphocytes Relative 11 (L) 12 - 46 %   Monocytes Relative 3 3 - 12 %   Eosinophils Relative 0 0 - 5 %   Basophils Relative 0 0 - 1 %   WBC Morphology VACUOLATED NEUTROPHILS     Comment: HYPERSEGMENTED NEUT   Smear Review LARGE PLATELETS PRESENT    Neutro Abs 8.2 (H) 1.7 - 7.7 K/uL   Lymphs Abs 1.1 0.7 - 4.0 K/uL   Monocytes Absolute 0.3 0.1 - 1.0 K/uL   Eosinophils Absolute 0.0 0.0 - 0.7 K/uL   Basophils Absolute 0.0 0.0 - 0.1 K/uL  Comprehensive metabolic panel     Status: Abnormal   Collection Time: 12/12/14  1:57 PM  Result Value Ref Range   Sodium 137 135 - 145 mmol/L   Potassium 3.4 (L) 3.5 - 5.1 mmol/L    Chloride 106 96 - 112 mmol/L   CO2 21 19 - 32 mmol/L   Glucose, Bld 124 (H) 70 - 99 mg/dL   BUN 7 6 - 23 mg/dL   Creatinine, Ser 0.76 0.50 - 1.10 mg/dL   Calcium 9.0 8.4 - 10.5 mg/dL   Total Protein 8.3 6.0 - 8.3 g/dL   Albumin 3.9 3.5 - 5.2 g/dL   AST 25 0 - 37 U/L   ALT 16 0 - 35 U/L   Alkaline Phosphatase 92 39 - 117 U/L   Total Bilirubin 0.4 0.3 - 1.2 mg/dL   GFR calc non Af Amer >90 >90 mL/min   GFR calc Af Amer >90 >90 mL/min    Comment: (NOTE) The eGFR has been calculated using the CKD EPI equation. This calculation has not been validated in all clinical situations. eGFR's persistently <90 mL/min signify possible Chronic Kidney Disease.    Anion gap 10 5 - 15  Lipase, blood     Status: None   Collection Time: 12/12/14  1:57 PM  Result Value Ref Range   Lipase 14 11 -  59 U/L  Urinalysis, Routine w reflex microscopic     Status: Abnormal   Collection Time: 12/12/14  2:54 PM  Result Value Ref Range   Color, Urine YELLOW YELLOW   APPearance CLOUDY (A) CLEAR   Specific Gravity, Urine 1.031 (H) 1.005 - 1.030   pH 6.0 5.0 - 8.0   Glucose, UA NEGATIVE NEGATIVE mg/dL   Hgb urine dipstick SMALL (A) NEGATIVE   Bilirubin Urine NEGATIVE NEGATIVE   Ketones, ur >80 (A) NEGATIVE mg/dL   Protein, ur 30 (A) NEGATIVE mg/dL   Urobilinogen, UA 0.2 0.0 - 1.0 mg/dL   Nitrite NEGATIVE NEGATIVE   Leukocytes, UA NEGATIVE NEGATIVE  Urine microscopic-add on     Status: Abnormal   Collection Time: 12/12/14  2:54 PM  Result Value Ref Range   Squamous Epithelial / LPF FEW (A) RARE   WBC, UA 0-2 <3 WBC/hpf   RBC / HPF 3-6 <3 RBC/hpf   Bacteria, UA MANY (A) RARE   Urine-Other MUCOUS PRESENT   Culture, blood (routine x 2)     Status: None (Preliminary result)   Collection Time: 12/12/14  6:50 PM  Result Value Ref Range   Specimen Description BLOOD RIGHT HAND  10 ML IN AEROBIC ONLY    Special Requests NONE    Culture             BLOOD CULTURE RECEIVED NO GROWTH TO DATE CULTURE WILL BE HELD  FOR 5 DAYS BEFORE ISSUING A FINAL NEGATIVE REPORT Performed at [facility]    Report Status PENDING   Magnesium     Status: None   Collection Time: 12/12/14  6:50 PM  Result Value Ref Range   Magnesium 1.6 1.5 - 2.5 mg/dL  Culture, blood (routine x 2)     Status: None (Preliminary result)   Collection Time: 12/12/14  7:00 PM  Result Value Ref Range   Specimen Description BLOOD RIGHT ARM  10 ML IN EACH BOTTLE    Special Requests NONE    Culture             BLOOD CULTURE RECEIVED NO GROWTH TO DATE CULTURE WILL BE HELD FOR 5 DAYS BEFORE ISSUING A FINAL NEGATIVE REPORT Performed at [facility]    Report Status PENDING   Lactic acid, plasma     Status: None   Collection Time: 12/12/14  7:55 PM  Result Value Ref Range   Lactic Acid, Venous 0.8 0.5 - 2.0 mmol/L  Procalcitonin - Baseline     Status: None   Collection Time: 12/12/14  7:56 PM  Result Value Ref Range   Procalcitonin <0.10 ng/mL    Comment:        Interpretation: PCT (Procalcitonin) <= 0.5 ng/mL: Systemic infection (sepsis) is not likely. Local bacterial infection is possible. (NOTE)         ICU PCT Algorithm               Non ICU PCT Algorithm    ----------------------------     ------------------------------         PCT < 0.25 ng/mL                 PCT < 0.1 ng/mL     Stopping of antibiotics            Stopping of antibiotics       strongly encouraged.               strongly encouraged.    ----------------------------     ------------------------------  PCT level decrease by               PCT < 0.25 ng/mL       >= 80% from peak PCT       OR PCT 0.25 - 0.5 ng/mL          Stopping of antibiotics                                             encouraged.     Stopping of antibiotics           encouraged.    ----------------------------     ------------------------------       PCT level decrease by              PCT >= 0.25 ng/mL       < 80% from peak PCT        AND PCT >= 0.5 ng/mL             Continuin g antibiotics                                              encouraged.       Continuing antibiotics            encouraged.    ----------------------------     ------------------------------     PCT level increase compared          PCT > 0.5 ng/mL         with peak PCT AND          PCT >= 0.5 ng/mL             Escalation of antibiotics                                          strongly encouraged.      Escalation of antibiotics        strongly encouraged.   Comprehensive metabolic panel     Status: Abnormal   Collection Time: 12/13/14  4:48 AM  Result Value Ref Range   Sodium 141 135 - 145 mmol/L   Potassium 3.3 (L) 3.5 - 5.1 mmol/L   Chloride 107 96 - 112 mmol/L   CO2 24 19 - 32 mmol/L   Glucose, Bld 92 70 - 99 mg/dL   BUN 5 (L) 6 - 23 mg/dL   Creatinine, Ser 0.81 0.50 - 1.10 mg/dL   Calcium 8.8 8.4 - 10.5 mg/dL   Total Protein 7.0 6.0 - 8.3 g/dL   Albumin 3.2 (L) 3.5 - 5.2 g/dL   AST 15 0 - 37 U/L   ALT 13 0 - 35 U/L   Alkaline Phosphatase 78 39 - 117 U/L   Total Bilirubin 0.4 0.3 - 1.2 mg/dL   GFR calc non Af Amer >90 >90 mL/min   GFR calc Af Amer >90 >90 mL/min    Comment: (NOTE) The eGFR has been calculated using the CKD EPI equation. This calculation has not been validated in all clinical situations. eGFR's persistently <90 mL/min signify possible Chronic Kidney Disease.    Anion gap 10 5 - 15  CBC with Differential/Platelet  Status: Abnormal   Collection Time: 12/13/14  4:48 AM  Result Value Ref Range   WBC 10.3 4.0 - 10.5 K/uL   RBC 3.76 (L) 3.87 - 5.11 MIL/uL   Hemoglobin 10.5 (L) 12.0 - 15.0 g/dL   HCT 31.9 (L) 36.0 - 46.0 %   MCV 84.8 78.0 - 100.0 fL   MCH 27.9 26.0 - 34.0 pg   MCHC 32.9 30.0 - 36.0 g/dL   RDW 14.5 11.5 - 15.5 %   Platelets 397 150 - 400 K/uL   Neutrophils Relative % 66 43 - 77 %   Lymphocytes Relative 22 12 - 46 %   Monocytes Relative 11 3 - 12 %   Eosinophils Relative 0 0 - 5 %   Basophils Relative 1 0 - 1 %   Neutro Abs  6.8 1.7 - 7.7 K/uL   Lymphs Abs 2.3 0.7 - 4.0 K/uL   Monocytes Absolute 1.1 (H) 0.1 - 1.0 K/uL   Eosinophils Absolute 0.0 0.0 - 0.7 K/uL   Basophils Absolute 0.1 0.0 - 0.1 K/uL   Smear Review MORPHOLOGY UNREMARKABLE   Procalcitonin     Status: None   Collection Time: 12/14/14  5:30 AM  Result Value Ref Range   Procalcitonin <0.10 ng/mL    Comment:        Interpretation: PCT (Procalcitonin) <= 0.5 ng/mL: Systemic infection (sepsis) is not likely. Local bacterial infection is possible. (NOTE)         ICU PCT Algorithm               Non ICU PCT Algorithm    ----------------------------     ------------------------------         PCT < 0.25 ng/mL                 PCT < 0.1 ng/mL     Stopping of antibiotics            Stopping of antibiotics       strongly encouraged.               strongly encouraged.    ----------------------------     ------------------------------       PCT level decrease by               PCT < 0.25 ng/mL       >= 80% from peak PCT       OR PCT 0.25 - 0.5 ng/mL          Stopping of antibiotics                                             encouraged.     Stopping of antibiotics           encouraged.    ----------------------------     ------------------------------       PCT level decrease by              PCT >= 0.25 ng/mL       < 80% from peak PCT        AND PCT >= 0.5 ng/mL            Continuin g antibiotics  encouraged.       Continuing antibiotics            encouraged.    ----------------------------     ------------------------------     PCT level increase compared          PCT > 0.5 ng/mL         with peak PCT AND          PCT >= 0.5 ng/mL             Escalation of antibiotics                                          strongly encouraged.      Escalation of antibiotics        strongly encouraged.   CBC     Status: Abnormal   Collection Time: 12/14/14  5:30 AM  Result Value Ref Range   WBC 12.4 (H) 4.0 - 10.5  K/uL   RBC 3.95 3.87 - 5.11 MIL/uL   Hemoglobin 11.0 (L) 12.0 - 15.0 g/dL   HCT 33.5 (L) 36.0 - 46.0 %   MCV 84.8 78.0 - 100.0 fL   MCH 27.8 26.0 - 34.0 pg   MCHC 32.8 30.0 - 36.0 g/dL   RDW 14.6 11.5 - 15.5 %   Platelets 385 150 - 400 K/uL  Basic metabolic panel     Status: Abnormal   Collection Time: 12/14/14  5:30 AM  Result Value Ref Range   Sodium 135 135 - 145 mmol/L   Potassium 4.0 3.5 - 5.1 mmol/L    Comment: RESULT REPEATED AND VERIFIED DELTA CHECK NOTED    Chloride 102 96 - 112 mmol/L   CO2 23 19 - 32 mmol/L   Glucose, Bld 115 (H) 70 - 99 mg/dL   BUN <5 (L) 6 - 23 mg/dL   Creatinine, Ser 0.71 0.50 - 1.10 mg/dL   Calcium 8.5 8.4 - 10.5 mg/dL   GFR calc non Af Amer >90 >90 mL/min   GFR calc Af Amer >90 >90 mL/min    Comment: (NOTE) The eGFR has been calculated using the CKD EPI equation. This calculation has not been validated in all clinical situations. eGFR's persistently <90 mL/min signify possible Chronic Kidney Disease.    Anion gap 10 5 - 15  Magnesium     Status: None   Collection Time: 12/14/14  5:30 AM  Result Value Ref Range   Magnesium 2.1 1.5 - 2.5 mg/dL    Radiology/Results: Ct Abdomen Pelvis W Contrast  12/12/2014   CLINICAL DATA:  Acute onset of generalized abdominal pain, nausea and vomiting. Initial encounter.  EXAM: CT ABDOMEN AND PELVIS WITH CONTRAST  TECHNIQUE: Multidetector CT imaging of the abdomen and pelvis was performed using the standard protocol following bolus administration of intravenous contrast.  CONTRAST:  131m OMNIPAQUE IOHEXOL 300 MG/ML  SOLN  COMPARISON:  CT of the abdomen and pelvis performed 12/05/2014  FINDINGS: The visualized lung bases are clear.  The liver and spleen are unremarkable in appearance. The gallbladder is within normal limits. The pancreas and adrenal glands are unremarkable.  The kidneys are unremarkable in appearance. There is no evidence of hydronephrosis. No renal or ureteral stones are seen. No perinephric  stranding is appreciated.  The small bowel is unremarkable in appearance. The stomach is within normal limits. No acute vascular abnormalities are seen.  There is  a new vague collection of fluid measuring approximately 4.2 x 4.2 x 1.7 cm at the right lower quadrant adjacent to the terminal ileum and cecum, with minimal peripheral enhancement and surrounding phlegmon. This is suspicious for evolving abscess. Surrounding pericecal lymphadenopathy is seen. A few tiny foci of free air are seen medially, concerning for contained perforation.  The appendix is difficult to fully characterize, but appears grossly unremarkable, on comparison with the prior study, seen more inferiorly at the right lower quadrant. There is minimal nonspecific haziness along the left paracolic gutter, new from the prior study. Mild inflammation is suggested along the ascending colon. Mild scattered diverticulosis is noted along the transverse and sigmoid colon.  The bladder is mildly distended and grossly unremarkable. A small urachal remnant is incidentally seen. The uterus is grossly unremarkable appearance. An intrauterine device is noted expected position at the fundus of the uterus. A 2.6 cm right adnexal cystic focus is likely physiologic in nature. The ovaries are otherwise unremarkable. No inguinal lymphadenopathy is seen.  No acute osseous abnormalities are identified.  IMPRESSION: 1. New vague 4.2 x 4.2 x 1.7 cm collection of fluid at the right lower quadrant, adjacent to the terminal ileum and cecum, with minimal peripheral enhancement and surrounding phlegmon. This is suspicious for evolving abscess. Few tiny foci of free air seen medially, concerning for contained perforation. Findings raise concern for worsening terminal ileitis, possibly reflecting Crohn's disease or infectious ileitis. 2. Surrounding pericecal lymphadenopathy seen. 3. Mild inflammation suggested along the ascending colon. Minimal nonspecific haziness along the  left paracolic gutter, new from the prior study. 4. Mild scattered diverticulosis along the transverse and sigmoid colon, without evidence of diverticulitis.  These results were called by telephone at the time of interpretation on 12/12/2014 at 6:25 pm to [facility], who verbally acknowledged these results.   Electronically Signed   By: Garald Balding M.D.   On: 12/12/2014 18:27   Dg Chest Port 1 View  12/12/2014   CLINICAL DATA:  Abdominal pain and vomiting.  Syncope.  EXAM: PORTABLE CHEST - 1 VIEW  COMPARISON:  None.  FINDINGS: The heart size and mediastinal contours are within normal limits. Both lungs are clear. The visualized skeletal structures are unremarkable.  IMPRESSION: No active disease.   Electronically Signed   By: Kerby Moors M.D.   On: 12/12/2014 18:16    Anti-infectives: Anti-infectives    Start     Dose/Rate Route Frequency Ordered Stop   12/12/14 2100  piperacillin-tazobactam (ZOSYN) IVPB 3.375 g     3.375 g 12.5 mL/hr over 240 Minutes Intravenous 3 times per day 12/12/14 1959     12/12/14 1745  ciprofloxacin (CIPRO) IVPB 400 mg  Status:  Discontinued     400 mg 200 mL/hr over 60 Minutes Intravenous Every 12 hours 12/12/14 1738 12/12/14 1959   12/12/14 1745  metroNIDAZOLE (FLAGYL) IVPB 500 mg  Status:  Discontinued     500 mg 100 mL/hr over 60 Minutes Intravenous Every 8 hours 12/12/14 1738 12/12/14 1959      Assessment/Plan: Problem List: Patient Active Problem List   Diagnosis Date Noted  . Ileitis 12/12/2014  . Abdominal pain 12/12/2014  . Nausea and vomiting 12/12/2014  . Hypokalemia 12/12/2014  . Anemia 12/12/2014  . Fibroids 12/12/2014  . Chills   . Nausea with vomiting   . Dehydration   . Tremor 09/16/2014    WBC is up.  May be forming an abscess and perhaps a repeat CT tomorrow is indicated.  Continue Zosyn and observation * No surgery found *    LOS: 2 days   Matt B. Hassell Done, MD, [facility] [phone]  beeper [phone]  12/14/2014 10:09 AM

## 2014-12-14 NOTE — Progress Notes (Signed)
Virginia Olson 12:24 PM  Subjective: Patient is not as good as she was yesterday and she says her clear liquids made her worse and she has vomited up her oral medicine but is passing gas from below and has no new complaints  Objective: Vital signs stable afebrile in more pain today than yesterday occasional bowel sounds still soft more tender particularly in the right lower quadrant without rebound white count up a little  Assessment: Right lower quadrant developing abscesses questionable etiology  Plan: Agree with surgical plan to proceed with CT sooner if she continues to have problems and will follow with you  [facility]

## 2014-12-15 LAB — BASIC METABOLIC PANEL
Anion gap: 8 (ref 5–15)
CALCIUM: 8.5 mg/dL (ref 8.4–10.5)
CO2: 24 mmol/L (ref 19–32)
Chloride: 101 mmol/L (ref 96–112)
Creatinine, Ser: 0.69 mg/dL (ref 0.50–1.10)
GLUCOSE: 98 mg/dL (ref 70–99)
POTASSIUM: 4.2 mmol/L (ref 3.5–5.1)
Sodium: 133 mmol/L — ABNORMAL LOW (ref 135–145)

## 2014-12-15 LAB — CBC WITH DIFFERENTIAL/PLATELET
Basophils Absolute: 0 10*3/uL (ref 0.0–0.1)
Basophils Relative: 0 % (ref 0–1)
EOS PCT: 0 % (ref 0–5)
Eosinophils Absolute: 0 10*3/uL (ref 0.0–0.7)
HEMATOCRIT: 33.9 % — AB (ref 36.0–46.0)
HEMOGLOBIN: 11 g/dL — AB (ref 12.0–15.0)
LYMPHS ABS: 1.6 10*3/uL (ref 0.7–4.0)
Lymphocytes Relative: 15 % (ref 12–46)
MCH: 27.6 pg (ref 26.0–34.0)
MCHC: 32.4 g/dL (ref 30.0–36.0)
MCV: 85.2 fL (ref 78.0–100.0)
MONOS PCT: 8 % (ref 3–12)
Monocytes Absolute: 0.8 10*3/uL (ref 0.1–1.0)
Neutro Abs: 7.9 10*3/uL — ABNORMAL HIGH (ref 1.7–7.7)
Neutrophils Relative %: 77 % (ref 43–77)
Platelets: 427 10*3/uL — ABNORMAL HIGH (ref 150–400)
RBC: 3.98 MIL/uL (ref 3.87–5.11)
RDW: 14.5 % (ref 11.5–15.5)
WBC: 10.3 10*3/uL (ref 4.0–10.5)

## 2014-12-15 MED ORDER — SODIUM CHLORIDE 0.9 % IV SOLN
INTRAVENOUS | Status: DC
  Administered 2014-12-16 – 2014-12-17 (×3): via INTRAVENOUS
  Filled 2014-12-15 (×4): qty 1000

## 2014-12-15 NOTE — Progress Notes (Signed)
[facility] PROGRESS NOTE  Virginia Olson ZWC:[id number] DOB: 06/17/1976 DOA: 12/12/2014 PCP: Antony Blackbird, MD  Assessment/Plan: #1 ileitis with early probable fluid collection Patient presented with nausea vomiting chills right lower quadrant abdominal pain. Repeat CT of the abdomen with new vague 4.2 x 4.2 x 1.7 cm fluid collection in the right lower quadrant adjacent to the terminal lumen and cecum with surrounding phlegmon. Concern for evolving abscess. Pericecal lymphadenopathy. Mild inflammation along the descending colon. Mild scattered diverticulosis along the transverse and sigmoid colon. Patient has been pancultured. Patient states nausea has been controlled on current regimen and abdominal pain being controlled on current pain regimen. WBC trending back down. Continue empiric IV Zosyn. Per general surgery may need repeat CT scan in a few days. GI following. General surgery following. Follow.  #2 dehydration IV fluids.  #3 right lower quadrant abdominal pain/nausea/vomiting Secondary to problem #1. Continue scheduled Phenergan for the next 2-3 days. Continue current pain regimen. See problem #1.   #4 hypokalemia Repleted.  #5 anemia Anemia panel pending. H&H stable. Follow H&H.  #6 prophylaxis Lovenox for DVT prophylaxis.  Code Status: Full Family Communication: Updated patient, no family at bedside. Disposition Plan: Remain inpatient.   Consultants:  GI: Dr. Watt Climes 12/13/2014  Gen. surgery: Dr. Marcello Moores 12/13/2014  Procedures:  CT abdomen and pelvis 12/12/2014  Chest x-ray 12/12/2014  Antibiotics:  IV Zosyn 12/12/2014  HPI/Subjective:  Patient states improvement with nausea and emesis. Patient states abdominal pain is controlled on current pain regimen.    Objective: Filed Vitals:   12/15/14 1021  BP: 114/57  Pulse: 70  Temp: 98.4 F (36.9 C)  Resp: 16    Intake/Output Summary (Last 24 hours) at 12/15/14 1136 Last data filed at 12/15/14 0900  Gross per 24 hour  Intake 3959.02 ml  Output   1750 ml  Net 2209.02 ml   Filed Weights   12/13/14 0522  Weight: 121.519 kg (267 lb 14.4 oz)    Exam:   General:  NAD  Cardiovascular: RRR  Respiratory: CTAB  Abdomen: Soft/ND/+TTP in RLQ  Musculoskeletal: No c/c/e  Data Reviewed: Basic Metabolic Panel:  Recent Labs Lab 12/10/14 1856 12/12/14 1357 12/12/14 1850 12/13/14 0448 12/14/14 0530 12/15/14 0456  NA 137 137  --  141 135 133*  K 3.5 3.4*  --  3.3* 4.0 4.2  CL 105 106  --  107 102 101  CO2 20 21  --  24 23 24   GLUCOSE 147* 124*  --  92 115* 98  BUN 6 7  --  5* <5* <5*  CREATININE 0.78 0.76  --  0.81 0.71 0.69  CALCIUM 9.4 9.0  --  8.8 8.5 8.5  MG  --   --  1.6  --  2.1  --    Liver Function Tests:  Recent Labs Lab 12/10/14 1856 12/12/14 1357 12/13/14 0448  AST 23 25 15   ALT 16 16 13   ALKPHOS 96 92 78  BILITOT 0.5 0.4 0.4  PROT 8.3 8.3 7.0  ALBUMIN 3.9 3.9 3.2*    Recent Labs Lab 12/10/14 1856 12/12/14 1357  LIPASE 14 14   No results for input(s): AMMONIA in the last 168 hours. CBC:  Recent Labs Lab 12/10/14 1856 12/12/14 1357 12/13/14 0448 12/14/14 0530 12/15/14 0456  WBC 8.0 9.6 10.3 12.4* 10.3  NEUTROABS 6.8 8.2* 6.8  --  7.9*  HGB 11.6* 11.8* 10.5* 11.0* 11.0*  HCT 34.5* 35.3* 31.9* 33.5* 33.9*  MCV 84.1 84.4 84.8 84.8 85.2  PLT 417* 464* 397 385 427*   Cardiac Enzymes: No results for input(s): CKTOTAL, CKMB, CKMBINDEX, TROPONINI in the last 168 hours. BNP (last 3 results) No results for input(s): BNP in the last 8760 hours.  ProBNP (last 3 results) No results for input(s): PROBNP in the last 8760 hours.  CBG: No results for input(s): GLUCAP in the last 168 hours.  Recent Results (from the past 240 hour(s))  Urine culture     Status: None   Collection Time: 12/10/14 10:00 PM  Result Value Ref Range Status   Specimen Description URINE, CLEAN CATCH  Final   Special Requests NONE  Final   Colony Count   Final     8,000 COLONIES/ML Performed at [facility]    Culture   Final    INSIGNIFICANT GROWTH Performed at [facility]    Report Status 12/12/2014 FINAL  Final  Culture, blood (routine x 2)     Status: None (Preliminary result)   Collection Time: 12/12/14  6:50 PM  Result Value Ref Range Status   Specimen Description BLOOD RIGHT HAND  10 ML IN AEROBIC ONLY  Final   Special Requests NONE  Final   Culture   Final           BLOOD CULTURE RECEIVED NO GROWTH TO DATE CULTURE WILL BE HELD FOR 5 DAYS BEFORE ISSUING A FINAL NEGATIVE REPORT Performed at [facility]    Report Status PENDING  Incomplete  Culture, blood (routine x 2)     Status: None (Preliminary result)   Collection Time: 12/12/14  7:00 PM  Result Value Ref Range Status   Specimen Description BLOOD RIGHT ARM  10 ML IN [facility] BOTTLE  Final   Special Requests NONE  Final   Culture   Final           BLOOD CULTURE RECEIVED NO GROWTH TO DATE CULTURE WILL BE HELD FOR 5 DAYS BEFORE ISSUING A FINAL NEGATIVE REPORT Performed at [facility]    Report Status PENDING  Incomplete     Studies: No results found.  Scheduled Meds: . enoxaparin (LOVENOX) injection  60 mg Subcutaneous Q24H  . piperacillin-tazobactam (ZOSYN)  IV  3.375 g Intravenous 3 times per day  . promethazine  25 mg Intravenous Q6H   Continuous Infusions: . sodium chloride 0.9 % 1,000 mL with potassium chloride 40 mEq infusion 75 mL/hr at 12/15/14 0121    Principal Problem:   Ileitis Active Problems:   Abdominal pain   Nausea and vomiting   Hypokalemia   Anemia   Fibroids    Time spent: 40 mins    [facility] MD [facility] Pager [phone]. If 7PM-7AM, please contact night-coverage at [email or web address], password [facility] 12/15/2014, 11:36 AM  LOS: 3 days

## 2014-12-15 NOTE — Progress Notes (Signed)
Subjective: She is up in chair, but complains pain is still severe in the RLQ, some diffuse pain, better with pain meds, again primarily the pain in the RLQ is the biggest issue.  That isn't really better.  Objective: Vital signs in last 24 hours: Temp:  [98.1 F (36.7 C)-99.4 F (37.4 C)] 98.2 F (36.8 C) (03/14 0555) Pulse Rate:  [71-73] 73 (03/14 0555) Resp:  [16] 16 (03/14 0555) BP: (126-147)/(63-85) 142/83 mmHg (03/14 0555) SpO2:  [97 %-100 %] 98 % (03/14 0555) Last BM Date: 12/10/14 240 PO  Diet: clears   Afebrile, VSS TM 99.4 WBC is better, Na is lower CT scan 12/12/14:  New vague 4.2 x 4.2 x 1.7 cm collection of fluid at the right lower quadrant, adjacent to the terminal ileum and cecum, with minimal peripheral enhancement and surrounding phlegmon. This is suspicious for evolving abscess. Few tiny foci of free air seen medially, concerning for contained perforation. Findings raise concern for worsening terminal ileitis, possibly reflecting Crohn's disease or infectious ileitis. 2. Surrounding pericecal lymphadenopathy seen. 3. Mild inflammation suggested along the ascending colon. Minimal nonspecific haziness along the left paracolic gutter, new from the prior study. 4. Mild scattered diverticulosis along the transverse and sigmoid colon, without evidence of diverticulitis.  ````````````` Intake/Output from previous day: 03/13 0701 - 03/14 0700 In: 3959 [P.O.:240; I.V.:3469.9; IV Piggyback:249.1] Out: 1150 [Urine:1150] Intake/Output this shift:    General appearance: alert, cooperative and no distress Resp: clear to auscultation bilaterally GI: soft, most tender in the RLQ, some diffuse pain across abdomen,but it's not really severe.  + BS, No BM  Lab Results:   Recent Labs  12/14/14 0530 12/15/14 0456  WBC 12.4* 10.3  HGB 11.0* 11.0*  HCT 33.5* 33.9*  PLT 385 427*    BMET  Recent Labs  12/14/14 0530 12/15/14 0456  NA 135 133*  K 4.0 4.2  CL  102 101  CO2 23 24  GLUCOSE 115* 98  BUN <5* <5*  CREATININE 0.71 0.69  CALCIUM 8.5 8.5   PT/INR No results for input(s): LABPROT, INR in the last 72 hours.   Recent Labs Lab 12/10/14 1856 12/12/14 1357 12/13/14 0448  AST 23 25 15   ALT 16 16 13   ALKPHOS 96 92 78  BILITOT 0.5 0.4 0.4  PROT 8.3 8.3 7.0  ALBUMIN 3.9 3.9 3.2*     Lipase     Component Value Date/Time   LIPASE 14 12/12/2014 1357     Studies/Results: No results found.  Medications: . enoxaparin (LOVENOX) injection  60 mg Subcutaneous Q24H  . piperacillin-tazobactam (ZOSYN)  IV  3.375 g Intravenous 3 times per day  . promethazine  25 mg Intravenous Q6H   . sodium chloride 0.9 % 1,000 mL with potassium chloride 40 mEq infusion 75 mL/hr at 12/15/14 0121   Prior to Admission medications   Medication Sig Start Date End Date Taking? Authorizing Provider  acetaminophen (TYLENOL) 500 MG tablet Take 1,000 mg by mouth every 6 (six) hours as needed for moderate pain (pain).   Yes Historical Provider, MD  cephALEXin (KEFLEX) 500 MG capsule Take 1 capsule (500 mg total) by mouth 2 (two) times daily. 12/11/14  Yes Joe Truitt Leep, PA-C  ondansetron (ZOFRAN ODT) 4 MG disintegrating tablet 4mg  ODT q4 hours prn nausea/vomit 12/05/14  Yes Debby Freiberg, MD  oxyCODONE-acetaminophen (PERCOCET) 5-325 MG per tablet Take 1-2 tablets by mouth every 6 (six) hours as needed. 12/11/14  Yes Dahlia Bailiff, PA-C  ondansetron (ZOFRAN) 4 MG tablet  Take 1 tablet (4 mg total) by mouth every 8 (eight) hours as needed for nausea or vomiting. 12/11/14   Dahlia Bailiff, PA-C     Assessment/Plan Terminal ileitis with phlegmon & Lymphadenopathy No prior history of inflammatory disease 2.5 days of Zosyn so far. Hx of anemia Hx of endometriosis/fibroids Lovenox/SCD for DVT prophylaxis    Plan:  Continue antibiotics, and watch to see how she does, repeat CT 5-7 days depending on clinical course.     LOS: 3 days    Lynanne Delgreco 12/15/2014

## 2014-12-15 NOTE — Progress Notes (Signed)
Pt sitting up in chair. States trying to eat earlier and instantly felt sick. Afraid to try and eat.

## 2014-12-15 NOTE — Progress Notes (Signed)
Virginia Olson 8:37 AM  Subjective: Patient actually doing better today but still little afraid to eat clear liquids and has no new complaints  Objective: Vital signs stable afebrile no acute distress abdomen is soft nontender white count decreased  Assessment: Right lower quadrant inflammatory area questionably etiology  Plan: Await surgical opinion would put off CT for a few more days if able and we discussed either going home when she is able to eat or if she is unable later in the week she might need TPN and we would still need to decide whether she needs a drain or not and I will ask one of my partners to see tomorrow and I will be back Wednesday and I encouraged her to walk as well  Akil Hoos E

## 2014-12-15 NOTE — Progress Notes (Signed)
ANTIBIOTIC CONSULT NOTE - follow up  Pharmacy Consult for zosyn Indication: intra-abdominal  No Known Allergies  Patient Measurements: Weight: 267 lb 14.4 oz (121.519 kg) Adjusted Body Weight:   Vital Signs: Temp: 98.2 F (36.8 C) (03/14 0555) Temp Source: Oral (03/14 0555) BP: 142/83 mmHg (03/14 0555) Pulse Rate: 73 (03/14 0555) Intake/Output from previous day: 03/13 0701 - 03/14 0700 In: 7414 [P.O.:240; I.V.:3469.9; IV Piggyback:249.1] Out: 1150 [Urine:1150] Intake/Output from this shift:    Labs:  Recent Labs  12/13/14 0448 12/14/14 0530 12/15/14 0456  WBC 10.3 12.4* 10.3  HGB 10.5* 11.0* 11.0*  PLT 397 385 427*  CREATININE 0.81 0.71 0.69   Estimated Creatinine Clearance: 132.7 mL/min (by C-G formula based on Cr of 0.69). No results for input(s): VANCOTROUGH, VANCOPEAK, VANCORANDOM, GENTTROUGH, GENTPEAK, GENTRANDOM, TOBRATROUGH, TOBRAPEAK, TOBRARND, AMIKACINPEAK, AMIKACINTROU, AMIKACIN in the last 72 hours.   Microbiology: Recent Results (from the past 720 hour(s))  Urine culture     Status: None   Collection Time: 12/10/14 10:00 PM  Result Value Ref Range Status   Specimen Description URINE, CLEAN CATCH  Final   Special Requests NONE  Final   Colony Count   Final    8,000 COLONIES/ML Performed at [facility]    Culture   Final    INSIGNIFICANT GROWTH Performed at [facility]    Report Status 12/12/2014 FINAL  Final  Culture, blood (routine x 2)     Status: None (Preliminary result)   Collection Time: 12/12/14  6:50 PM  Result Value Ref Range Status   Specimen Description BLOOD RIGHT HAND  10 ML IN AEROBIC ONLY  Final   Special Requests NONE  Final   Culture   Final           BLOOD CULTURE RECEIVED NO GROWTH TO DATE CULTURE WILL BE HELD FOR 5 DAYS BEFORE ISSUING A FINAL NEGATIVE REPORT Performed at [facility]    Report Status PENDING  Incomplete  Culture, blood (routine x 2)     Status: None (Preliminary result)   Collection Time: 12/12/14  7:00 PM  Result Value Ref Range Status   Specimen Description BLOOD RIGHT ARM  10 ML IN [facility] BOTTLE  Final   Special Requests NONE  Final   Culture   Final           BLOOD CULTURE RECEIVED NO GROWTH TO DATE CULTURE WILL BE HELD FOR 5 DAYS BEFORE ISSUING A FINAL NEGATIVE REPORT Performed at [facility]    Report Status PENDING  Incomplete    Medical History: Past Medical History  Diagnosis Date  . Endometriosis   . Anemia   . Fibroids 12/12/2014   Assessment: 72 YOF presents with N/V and abdominal pain.  Per surgery, patient with terminal ileitis.  Originally start ciprofloxacin/flagyl but no doses given,  [facility] wishes to change to zosyn due to clinical picture on 3/11  3/11 >> zosyn  >>  Tmax: afebrile WBCs: improved Renal: SCr stable. CrCl > 100  3/11 blood: ngtd  Goal of Therapy:  Dose for patient-specific parameters and indication  Plan:   Continue Zosyn 3.375gm IV q8h over 4h infusion  No further dosing adjustment needed at this time   Adrian Saran, PharmD, BCPS Pager [phone] 12/15/2014 9:50 AM

## 2014-12-15 NOTE — Care Management Note (Signed)
CARE MANAGEMENT NOTE 12/15/2014  Patient:  Virginia Olson, Virginia Olson   Account Number:  [id number]  Date Initiated:  12/15/2014  Documentation initiated by:  Marney Doctor  Subjective/Objective Assessment:   39 yo admitted with Ileitis.     Action/Plan:   From home with spouse   Anticipated DC Date:  12/18/2014   Anticipated DC Plan:  [place]  CM consult      Choice offered to / List presented to:             Status of service:  In process, will continue to follow Medicare Important Message given?   (If response is "NO", the following Medicare IM given date fields will be blank) Date Medicare IM given:   Medicare IM given by:   Date Additional Medicare IM given:   Additional Medicare IM given by:    Discharge Disposition:    Per UR Regulation:  Reviewed for med. necessity/level of care/duration of stay  If discussed at [place] of Stay Meetings, dates discussed:    Comments:  12/15/14 Marney Doctor RN,BSN,NCM [phone] Chart reviewed and CM following for DC needs.

## 2014-12-16 LAB — CBC WITH DIFFERENTIAL/PLATELET
BASOS ABS: 0 10*3/uL (ref 0.0–0.1)
BASOS PCT: 1 % (ref 0–1)
Eosinophils Absolute: 0.1 10*3/uL (ref 0.0–0.7)
Eosinophils Relative: 2 % (ref 0–5)
HEMATOCRIT: 32.7 % — AB (ref 36.0–46.0)
Hemoglobin: 10.7 g/dL — ABNORMAL LOW (ref 12.0–15.0)
LYMPHS PCT: 37 % (ref 12–46)
Lymphs Abs: 2.5 10*3/uL (ref 0.7–4.0)
MCH: 27.8 pg (ref 26.0–34.0)
MCHC: 32.7 g/dL (ref 30.0–36.0)
MCV: 84.9 fL (ref 78.0–100.0)
MONO ABS: 0.6 10*3/uL (ref 0.1–1.0)
Monocytes Relative: 9 % (ref 3–12)
NEUTROS ABS: 3.4 10*3/uL (ref 1.7–7.7)
NEUTROS PCT: 51 % (ref 43–77)
Platelets: 362 10*3/uL (ref 150–400)
RBC: 3.85 MIL/uL — AB (ref 3.87–5.11)
RDW: 14.5 % (ref 11.5–15.5)
WBC: 6.6 10*3/uL (ref 4.0–10.5)

## 2014-12-16 LAB — BASIC METABOLIC PANEL
ANION GAP: 10 (ref 5–15)
BUN: <5 mg/dL — ABNORMAL LOW (ref 6–23)
CALCIUM: 8.6 mg/dL (ref 8.4–10.5)
CO2: 24 mmol/L (ref 19–32)
CREATININE: 0.76 mg/dL (ref 0.50–1.10)
Chloride: 104 mmol/L (ref 96–112)
GFR calc Af Amer: >90 mL/min (ref >90)
GFR calc non Af Amer: >90 mL/min (ref >90)
Glucose, Bld: 90 mg/dL (ref 70–99)
Potassium: 4.1 mmol/L (ref 3.5–5.1)
Sodium: 138 mmol/L (ref 135–145)

## 2014-12-16 LAB — PROCALCITONIN: Procalcitonin: <0.1 ng/mL

## 2014-12-16 NOTE — Progress Notes (Signed)
[facility] PROGRESS NOTE  Virginia Olson YWV:[id number] DOB: 05-09-1976 DOA: 12/12/2014 PCP: Antony Blackbird, MD  Assessment/Plan: #1 ileitis with early probable fluid collection Patient presented with nausea vomiting chills right lower quadrant abdominal pain. Repeat CT of the abdomen with new vague 4.2 x 4.2 x 1.7 cm fluid collection in the right lower quadrant adjacent to the terminal lumen and cecum with surrounding phlegmon. Concern for evolving abscess. Pericecal lymphadenopathy. Mild inflammation along the descending colon. Mild scattered diverticulosis along the transverse and sigmoid colon. Patient has been pancultured. Patient states nausea has been controlled on current regimen and abdominal pain being controlled on current pain regimen. WBC trending back down. Continue empiric IV Zosyn. Patient's diet has been advanced to a full liquid diet. Per general surgery may need repeat CT scan in a few days. GI following. General surgery following. Follow.  #2 dehydration IV fluids.  #3 right lower quadrant abdominal pain/nausea/vomiting Secondary to problem #1. Continue scheduled Phenergan for the next 2-3 days. Continue current pain regimen. See problem #1.   #4 hypokalemia Repleted.  #5 anemia Anemia panel pending. H&H stable. Follow H&H.  #6 prophylaxis Lovenox for DVT prophylaxis.  Code Status: Full Family Communication: Updated patient, no family at bedside. Disposition Plan: Remain inpatient.   Consultants:  GI: Dr. Watt Climes 12/13/2014  Gen. surgery: Dr. Marcello Moores 12/13/2014  Procedures:  CT abdomen and pelvis 12/12/2014  Chest x-ray 12/12/2014  Antibiotics:  IV Zosyn 12/12/2014  HPI/Subjective:  Patient states improvement with nausea and emesis. Patient states abdominal pain is controlled on current pain regimen. Patient tolerating full liquid diet.  Objective: Filed Vitals:   12/16/14 0956  BP: 126/74  Pulse: 97  Temp: 98.8 F (37.1 C)  Resp: 18     Intake/Output Summary (Last 24 hours) at 12/16/14 1227 Last data filed at 12/16/14 8546  Gross per 24 hour  Intake   1430 ml  Output    400 ml  Net   1030 ml   Filed Weights   12/12/14 1228 12/13/14 0522 12/16/14 0613  Weight: 121.11 kg (267 lb) 121.519 kg (267 lb 14.4 oz) 121.927 kg (268 lb 12.8 oz)    Exam:   General:  NAD  Cardiovascular: RRR  Respiratory: CTAB  Abdomen: Soft/ND/+TTP in RLQ  Musculoskeletal: No c/c/e  Data Reviewed: Basic Metabolic Panel:  Recent Labs Lab 12/12/14 1357 12/12/14 1850 12/13/14 0448 12/14/14 0530 12/15/14 0456 12/16/14 0500  NA 137  --  141 135 133* 138  K 3.4*  --  3.3* 4.0 4.2 4.1  CL 106  --  107 102 101 104  CO2 21  --  24 23 24 24   GLUCOSE 124*  --  92 115* 98 90  BUN 7  --  5* <5* <5* <5*  CREATININE 0.76  --  0.81 0.71 0.69 0.76  CALCIUM 9.0  --  8.8 8.5 8.5 8.6  MG  --  1.6  --  2.1  --   --    Liver Function Tests:  Recent Labs Lab 12/10/14 1856 12/12/14 1357 12/13/14 0448  AST 23 25 15   ALT 16 16 13   ALKPHOS 96 92 78  BILITOT 0.5 0.4 0.4  PROT 8.3 8.3 7.0  ALBUMIN 3.9 3.9 3.2*    Recent Labs Lab 12/10/14 1856 12/12/14 1357  LIPASE 14 14   No results for input(s): AMMONIA in the last 168 hours. CBC:  Recent Labs Lab 12/10/14 1856 12/12/14 1357 12/13/14 0448 12/14/14 0530 12/15/14 0456 12/16/14 0500  WBC 8.0  9.6 10.3 12.4* 10.3 6.6  NEUTROABS 6.8 8.2* 6.8  --  7.9* 3.4  HGB 11.6* 11.8* 10.5* 11.0* 11.0* 10.7*  HCT 34.5* 35.3* 31.9* 33.5* 33.9* 32.7*  MCV 84.1 84.4 84.8 84.8 85.2 84.9  PLT 417* 464* 397 385 427* 362   Cardiac Enzymes: No results for input(s): CKTOTAL, CKMB, CKMBINDEX, TROPONINI in the last 168 hours. BNP (last 3 results) No results for input(s): BNP in the last 8760 hours.  ProBNP (last 3 results) No results for input(s): PROBNP in the last 8760 hours.  CBG: No results for input(s): GLUCAP in the last 168 hours.  Recent Results (from the past 240 hour(s))   Urine culture     Status: None   Collection Time: 12/10/14 10:00 PM  Result Value Ref Range Status   Specimen Description URINE, CLEAN CATCH  Final   Special Requests NONE  Final   Colony Count   Final    8,000 COLONIES/ML Performed at [facility]    Culture   Final    INSIGNIFICANT GROWTH Performed at [facility]    Report Status 12/12/2014 FINAL  Final  Culture, blood (routine x 2)     Status: None (Preliminary result)   Collection Time: 12/12/14  6:50 PM  Result Value Ref Range Status   Specimen Description BLOOD RIGHT HAND  10 ML IN AEROBIC ONLY  Final   Special Requests NONE  Final   Culture   Final           BLOOD CULTURE RECEIVED NO GROWTH TO DATE CULTURE WILL BE HELD FOR 5 DAYS BEFORE ISSUING A FINAL NEGATIVE REPORT Performed at [facility]    Report Status PENDING  Incomplete  Culture, blood (routine x 2)     Status: None (Preliminary result)   Collection Time: 12/12/14  7:00 PM  Result Value Ref Range Status   Specimen Description BLOOD RIGHT ARM  10 ML IN [facility] BOTTLE  Final   Special Requests NONE  Final   Culture   Final           BLOOD CULTURE RECEIVED NO GROWTH TO DATE CULTURE WILL BE HELD FOR 5 DAYS BEFORE ISSUING A FINAL NEGATIVE REPORT Performed at [facility]    Report Status PENDING  Incomplete     Studies: No results found.  Scheduled Meds: . enoxaparin (LOVENOX) injection  60 mg Subcutaneous Q24H  . piperacillin-tazobactam (ZOSYN)  IV  3.375 g Intravenous 3 times per day   Continuous Infusions: . sodium chloride 0.9 % 1,000 mL infusion 75 mL/hr at 12/16/14 0014    Principal Problem:   Ileitis Active Problems:   Abdominal pain   Nausea and vomiting   Hypokalemia   Anemia   Fibroids    Time spent: 40 mins    [facility] MD [facility] Pager [phone]. If 7PM-7AM, please contact night-coverage at [email or web address], password [facility] 12/16/2014, 12:27 PM  LOS: 4 days

## 2014-12-16 NOTE — Plan of Care (Signed)
Problem: Phase II Progression Outcomes Goal: Progress activity as tolerated unless otherwise ordered Outcome: Progressing Patient ambulated in the hallway this morning. Tolerated the activity.

## 2014-12-16 NOTE — Progress Notes (Signed)
  Subjective: She feels better, mildly tender RLQ, better than yesterday.  No nausea.  Objective: Vital signs in last 24 hours: Temp:  [98.1 F (36.7 C)-98.7 F (37.1 C)] 98.1 F (36.7 C) (03/15 0517) Pulse Rate:  [70-87] 85 (03/15 0517) Resp:  [16] 16 (03/15 0517) BP: (100-128)/(54-84) 100/63 mmHg (03/15 0517) SpO2:  [97 %-100 %] 98 % (03/15 0517) Weight:  [121.927 kg (268 lb 12.8 oz)] 121.927 kg (268 lb 12.8 oz) (03/15 0613) Last BM Date: 12/10/14 290 po AFEBRILE, VSS Labs OK, WBC down more. Intake/Output from previous day: 03/14 0701 - 03/15 0700 In: 1310 [P.O.:290; I.V.:970; IV Piggyback:50] Out: 1000 [Urine:1000] Intake/Output this shift:    General appearance: alert, cooperative and no distress Resp: clear to auscultation bilaterally GI: soft, minimally tender RLQ.  +BS.  Lab Results:   Recent Labs  12/15/14 0456 12/16/14 0500  WBC 10.3 6.6  HGB 11.0* 10.7*  HCT 33.9* 32.7*  PLT 427* 362    BMET  Recent Labs  12/15/14 0456 12/16/14 0500  NA 133* 138  K 4.2 4.1  CL 101 104  CO2 24 24  GLUCOSE 98 90  BUN <5* <5*  CREATININE 0.69 0.76  CALCIUM 8.5 8.6   PT/INR No results for input(s): LABPROT, INR in the last 72 hours.   Recent Labs Lab 12/10/14 1856 12/12/14 1357 12/13/14 0448  AST 23 25 15   ALT 16 16 13   ALKPHOS 96 92 78  BILITOT 0.5 0.4 0.4  PROT 8.3 8.3 7.0  ALBUMIN 3.9 3.9 3.2*     Lipase     Component Value Date/Time   LIPASE 14 12/12/2014 1357     Studies/Results: No results found.  Medications: . enoxaparin (LOVENOX) injection  60 mg Subcutaneous Q24H  . piperacillin-tazobactam (ZOSYN)  IV  3.375 g Intravenous 3 times per day  . promethazine  25 mg Intravenous Q6H    Assessment/Plan Terminal ileitis with phlegmon & Lymphadenopathy No prior history of inflammatory disease 3.5 days of Zosyn so far. Hx of anemia Hx of endometriosis/fibroids Lovenox/SCD for DVT prophylaxis   Plan:  Full liquids, shower, ambulate,  continue antibiotics.     LOS: 4 days    Virginia Olson 12/16/2014

## 2014-12-16 NOTE — Progress Notes (Signed)
Patient ID: Virginia Olson, female   DOB: 08-06-1976, 39 y.o.   MRN: [id number] [facility] Gastroenterology Progress Note  Isatu Hartland 39 y.o. 1976/08/02   Subjective: Having less abdominal pain today compared with yesterday. Belching and flatus. No BMs. Tolerating clear liquids except had abdominal pain with drinking cranberry juice.  Objective: Vital signs in last 24 hours: Filed Vitals:   12/16/14 0517  BP: 100/63  Pulse: 85  Temp: 98.1 F (36.7 C)  Resp: 16    Physical Exam: Gen: awake, alert, no acute distress Abd: RLQ tenderness with guarding, soft, nondistended, +BS  Lab Results:  Recent Labs  12/14/14 0530 12/15/14 0456 12/16/14 0500  NA 135 133* 138  K 4.0 4.2 4.1  CL 102 101 104  CO2 23 24 24   GLUCOSE 115* 98 90  BUN <5* <5* <5*  CREATININE 0.71 0.69 0.76  CALCIUM 8.5 8.5 8.6  MG 2.1  --   --    No results for input(s): AST, ALT, ALKPHOS, BILITOT, PROT, ALBUMIN in the last 72 hours.  Recent Labs  12/15/14 0456 12/16/14 0500  WBC 10.3 6.6  NEUTROABS 7.9* 3.4  HGB 11.0* 10.7*  HCT 33.9* 32.7*  MCV 85.2 84.9  PLT 427* 362   No results for input(s): LABPROT, INR in the last 72 hours.    Assessment/Plan: RLQ phlegmon with terminal ileitis - continue broad spectrum antibiotics, full liquid as tolerated, encouraged ambulation, supportive care. Dr. Watt Climes to see tomorrow.   Troy C. 12/16/2014, 9:03 AM

## 2014-12-17 DIAGNOSIS — R112 Nausea with vomiting, unspecified: Secondary | ICD-10-CM

## 2014-12-17 LAB — CBC WITH DIFFERENTIAL/PLATELET
Basophils Absolute: 0.1 10*3/uL (ref 0.0–0.1)
Basophils Relative: 1 % (ref 0–1)
Eosinophils Absolute: 0.2 10*3/uL (ref 0.0–0.7)
Eosinophils Relative: 3 % (ref 0–5)
HCT: 31.7 % — ABNORMAL LOW (ref 36.0–46.0)
HEMOGLOBIN: 10.4 g/dL — AB (ref 12.0–15.0)
LYMPHS ABS: 2.5 10*3/uL (ref 0.7–4.0)
LYMPHS PCT: 43 % (ref 12–46)
MCH: 27.7 pg (ref 26.0–34.0)
MCHC: 32.8 g/dL (ref 30.0–36.0)
MCV: 84.3 fL (ref 78.0–100.0)
MONOS PCT: 11 % (ref 3–12)
Monocytes Absolute: 0.6 10*3/uL (ref 0.1–1.0)
NEUTROS ABS: 2.5 10*3/uL (ref 1.7–7.7)
NEUTROS PCT: 42 % — AB (ref 43–77)
Platelets: 405 10*3/uL — ABNORMAL HIGH (ref 150–400)
RBC: 3.76 MIL/uL — ABNORMAL LOW (ref 3.87–5.11)
RDW: 14.6 % (ref 11.5–15.5)
WBC: 5.8 10*3/uL (ref 4.0–10.5)

## 2014-12-17 LAB — BASIC METABOLIC PANEL
Anion gap: 12 (ref 5–15)
CALCIUM: 8.8 mg/dL (ref 8.4–10.5)
CO2: 26 mmol/L (ref 19–32)
CREATININE: 0.75 mg/dL (ref 0.50–1.10)
Chloride: 101 mmol/L (ref 96–112)
GFR calc non Af Amer: >90 mL/min (ref >90)
Glucose, Bld: 99 mg/dL (ref 70–99)
Potassium: 3.9 mmol/L (ref 3.5–5.1)
Sodium: 139 mmol/L (ref 135–145)

## 2014-12-17 MED ORDER — AMOXICILLIN-POT CLAVULANATE 875-125 MG PO TABS
1.0000 | ORAL_TABLET | Freq: Two times a day (BID) | ORAL | Status: DC
  Administered 2014-12-17 – 2014-12-18 (×3): 1 via ORAL
  Filled 2014-12-17 (×4): qty 1

## 2014-12-17 MED ORDER — SACCHAROMYCES BOULARDII 250 MG PO CAPS
250.0000 mg | ORAL_CAPSULE | Freq: Two times a day (BID) | ORAL | Status: DC
  Administered 2014-12-17 – 2014-12-18 (×3): 250 mg via ORAL
  Filled 2014-12-17 (×4): qty 1

## 2014-12-17 NOTE — Progress Notes (Signed)
[facility] PROGRESS NOTE  Virginia Olson HYW:[id number] DOB: 1975/10/20 DOA: 12/12/2014 PCP: Antony Blackbird, MD  Assessment/Plan: #1 ileitis with early probable fluid collection Patient presented with nausea vomiting chills right lower quadrant abdominal pain. Repeat CT of the abdomen with new vague 4.2 x 4.2 x 1.7 cm fluid collection in the right lower quadrant adjacent to the terminal lumen and cecum with surrounding phlegmon. Concern for evolving abscess. Pericecal lymphadenopathy. Mild inflammation along the descending colon. Mild scattered diverticulosis along the transverse and sigmoid colon. Patient has been pancultured. Patient states nausea has been controlled on current regimen and abdominal pain being controlled on current pain regimen. -Will stop IV antibiotic therapy today, transition to Augmentin 8 any 5 by mouth twice a day -Anticipate discharging in the next 24 hours if tolerates regular diet  #2 dehydration -Stopping IV fluids, encourage oral intake  #3 right lower quadrant abdominal pain/nausea/vomiting Secondary to problem #1. Continue scheduled Phenergan for the next 2-3 days. Continue current pain regimen. See problem #1.   #4 hypokalemia Repleted.  #5 anemia Anemia panel pending. H&H stable. Follow H&H.  #6 prophylaxis Lovenox for DVT prophylaxis.  Code Status: Full Family Communication: Updated patient, no family at bedside. Disposition Plan: Anticipate discharge in the next 24 hours if tolerating by mouth intake   Consultants:  GI: Dr. Watt Climes 12/13/2014  Gen. surgery: Dr. Marcello Moores 12/13/2014  Procedures:  CT abdomen and pelvis 12/12/2014  Chest x-ray 12/12/2014  Antibiotics:  IV Zosyn 12/12/2014  HPI/Subjective:  Patient states feeling better today, has not vomited. Diet advanced to regular  Objective: Filed Vitals:   12/17/14 0459  BP: 138/80  Pulse: 75  Temp: 98.3 F (36.8 C)  Resp: 16    Intake/Output Summary (Last 24 hours) at  12/17/14 1323 Last data filed at 12/17/14 4854  Gross per 24 hour  Intake   2075 ml  Output   1925 ml  Net    150 ml   Filed Weights   12/13/14 0522 12/16/14 0613 12/17/14 0459  Weight: 121.519 kg (267 lb 14.4 oz) 121.927 kg (268 lb 12.8 oz) 123.288 kg (271 lb 12.8 oz)    Exam:   General:  NAD  Cardiovascular: RRR  Respiratory: CTAB  Abdomen: Soft/ND/+TTP in RLQ  Musculoskeletal: No c/c/e  Data Reviewed: Basic Metabolic Panel:  Recent Labs Lab 12/12/14 1850 12/13/14 0448 12/14/14 0530 12/15/14 0456 12/16/14 0500 12/17/14 0615  NA  --  141 135 133* 138 139  K  --  3.3* 4.0 4.2 4.1 3.9  CL  --  107 102 101 104 101  CO2  --  24 23 24 24 26   GLUCOSE  --  92 115* 98 90 99  BUN  --  5* <5* <5* <5* <5*  CREATININE  --  0.81 0.71 0.69 0.76 0.75  CALCIUM  --  8.8 8.5 8.5 8.6 8.8  MG 1.6  --  2.1  --   --   --    Liver Function Tests:  Recent Labs Lab 12/10/14 1856 12/12/14 1357 12/13/14 0448  AST 23 25 15   ALT 16 16 13   ALKPHOS 96 92 78  BILITOT 0.5 0.4 0.4  PROT 8.3 8.3 7.0  ALBUMIN 3.9 3.9 3.2*    Recent Labs Lab 12/10/14 1856 12/12/14 1357  LIPASE 14 14   No results for input(s): AMMONIA in the last 168 hours. CBC:  Recent Labs Lab 12/12/14 1357 12/13/14 0448 12/14/14 0530 12/15/14 0456 12/16/14 0500 12/17/14 0615  WBC 9.6 10.3 12.4*  10.3 6.6 5.8  NEUTROABS 8.2* 6.8  --  7.9* 3.4 2.5  HGB 11.8* 10.5* 11.0* 11.0* 10.7* 10.4*  HCT 35.3* 31.9* 33.5* 33.9* 32.7* 31.7*  MCV 84.4 84.8 84.8 85.2 84.9 84.3  PLT 464* 397 385 427* 362 405*   Cardiac Enzymes: No results for input(s): CKTOTAL, CKMB, CKMBINDEX, TROPONINI in the last 168 hours. BNP (last 3 results) No results for input(s): BNP in the last 8760 hours.  ProBNP (last 3 results) No results for input(s): PROBNP in the last 8760 hours.  CBG: No results for input(s): GLUCAP in the last 168 hours.  Recent Results (from the past 240 hour(s))  Urine culture     Status: None    Collection Time: 12/10/14 10:00 PM  Result Value Ref Range Status   Specimen Description URINE, CLEAN CATCH  Final   Special Requests NONE  Final   Colony Count   Final    8,000 COLONIES/ML Performed at [facility]    Culture   Final    INSIGNIFICANT GROWTH Performed at [facility]    Report Status 12/12/2014 FINAL  Final  Culture, blood (routine x 2)     Status: None (Preliminary result)   Collection Time: 12/12/14  6:50 PM  Result Value Ref Range Status   Specimen Description BLOOD RIGHT HAND  10 ML IN AEROBIC ONLY  Final   Special Requests NONE  Final   Culture   Final           BLOOD CULTURE RECEIVED NO GROWTH TO DATE CULTURE WILL BE HELD FOR 5 DAYS BEFORE ISSUING A FINAL NEGATIVE REPORT Performed at [facility]    Report Status PENDING  Incomplete  Culture, blood (routine x 2)     Status: None (Preliminary result)   Collection Time: 12/12/14  7:00 PM  Result Value Ref Range Status   Specimen Description BLOOD RIGHT ARM  10 ML IN [facility] BOTTLE  Final   Special Requests NONE  Final   Culture   Final           BLOOD CULTURE RECEIVED NO GROWTH TO DATE CULTURE WILL BE HELD FOR 5 DAYS BEFORE ISSUING A FINAL NEGATIVE REPORT Performed at [facility]    Report Status PENDING  Incomplete     Studies: No results found.  Scheduled Meds: . amoxicillin-clavulanate  1 tablet Oral BID  . enoxaparin (LOVENOX) injection  60 mg Subcutaneous Q24H  . saccharomyces boulardii  250 mg Oral BID   Continuous Infusions: . sodium chloride 0.9 % 1,000 mL infusion 75 mL/hr at 12/17/14 1116    Principal Problem:   Ileitis Active Problems:   Abdominal pain   Nausea and vomiting   Hypokalemia   Anemia   Fibroids    Time spent: 25 mins    Kelvin Cellar MD [facility] Pager [phone]. If 7PM-7AM, please contact night-coverage at [email or web address], password [email or web address] 12/17/2014, 1:23 PM  LOS: 5 days

## 2014-12-17 NOTE — Progress Notes (Signed)
Virginia Olson 9:52 AM  Subjective: Patient is doing better and is about to eat solid food and did move her bowels a little and has no new complaints and is walking some  Objective: Vital signs stable afebrile no acute distress abdomen is soft nontender white count okay  Assessment: Right lower quadrant inflammatory phlegmon questionable etiology  Plan: Agree with surgical note if she tolerates regular food would change her to oral antibiotics later today or tomorrow and possibly she could go home Thursday and repeat CAT scan on Friday or Monday and then based on those findings we will discuss GI follow-up versus a drain with interventional radiology and then GI follow-up  [place]offey E

## 2014-12-17 NOTE — Progress Notes (Signed)
  Subjective: She continues to feel better still some soreness in RLQ, but much better.  Tolerated full liquids well.  Objective: Vital signs in last 24 hours: Temp:  [98.3 F (36.8 C)-98.8 F (37.1 C)] 98.3 F (36.8 C) (03/16 0459) Pulse Rate:  [71-97] 75 (03/16 0459) Resp:  [16-18] 16 (03/16 0459) BP: (113-138)/(59-80) 138/80 mmHg (03/16 0459) SpO2:  [99 %] 99 % (03/16 0459) Weight:  [123.288 kg (271 lb 12.8 oz)] 123.288 kg (271 lb 12.8 oz) (03/16 0459) Last BM Date: 12/10/14 360 PO recorded 1 BM recorded Afebrile, VSS Labs OK, WBC continues to decline. Intake/Output from previous day: 03/15 0701 - 03/16 0700 In: 2195 [P.O.:360; I.V.:1735; IV Piggyback:100] Out: 1025 [Urine:1025] Intake/Output this shift:    General appearance: alert, cooperative and no distress GI: soft, still some discomfort RLQ, but much improved.  Lab Results:   Recent Labs  12/16/14 0500 12/17/14 0615  WBC 6.6 5.8  HGB 10.7* 10.4*  HCT 32.7* 31.7*  PLT 362 405*    BMET  Recent Labs  12/16/14 0500 12/17/14 0615  NA 138 139  K 4.1 3.9  CL 104 101  CO2 24 26  GLUCOSE 90 99  BUN <5* <5*  CREATININE 0.76 0.75  CALCIUM 8.6 8.8   PT/INR No results for input(s): LABPROT, INR in the last 72 hours.   Recent Labs Lab 12/10/14 1856 12/12/14 1357 12/13/14 0448  AST 23 25 15   ALT 16 16 13   ALKPHOS 96 92 78  BILITOT 0.5 0.4 0.4  PROT 8.3 8.3 7.0  ALBUMIN 3.9 3.9 3.2*     Lipase     Component Value Date/Time   LIPASE 14 12/12/2014 1357     Studies/Results: No results found.  Medications: . enoxaparin (LOVENOX) injection  60 mg Subcutaneous Q24H  . piperacillin-tazobactam (ZOSYN)  IV  3.375 g Intravenous 3 times per day    Assessment/Plan Terminal ileitis with phlegmon & Lymphadenopathy No prior history of inflammatory disease 4.5 days of Zosyn so far. Hx of anemia Hx of endometriosis/fibroids Lovenox/SCD for DVT prophylaxis   Plan:  From our standpoint she is much  better, and Dr. Excell Seltzer thinks we could advance her diet and convert her to oral antibiotics.  He would defer to GI on antibiotics and duration.  She is anxious to get home.  We can see as an outpatient on a PRN basis. I will start her on a soft diet this AM.  I will also add probiotics.    LOS: 5 days    Darlene Brozowski 12/17/2014

## 2014-12-18 MED ORDER — SACCHAROMYCES BOULARDII 250 MG PO CAPS: 250.0000 mg | ORAL_CAPSULE | Freq: Two times a day (BID) | ORAL | Status: DC

## 2014-12-18 MED ORDER — SORBITOL 70 % SOLN: 30.0000 mL | Freq: Every day | Status: DC | PRN

## 2014-12-18 MED ORDER — POLYETHYLENE GLYCOL 3350 17 G PO PACK: 17.0000 g | PACK | Freq: Every day | ORAL | Status: DC | PRN

## 2014-12-18 MED ORDER — AMOXICILLIN-POT CLAVULANATE 875-125 MG PO TABS: 1.0000 | ORAL_TABLET | Freq: Two times a day (BID) | ORAL | Status: DC

## 2014-12-18 NOTE — Progress Notes (Signed)
  Subjective: She did well and her kids will be glad to have her home.   Objective: Vital signs in last 24 hours: Temp:  [98.2 F (36.8 C)-99.1 F (37.3 C)] 98.2 F (36.8 C) (03/17 0457) Pulse Rate:  [74-93] 93 (03/17 0457) Resp:  [16] 16 (03/17 0457) BP: (106-115)/(57-74) 106/57 mmHg (03/17 0457) SpO2:  [99 %-100 %] 99 % (03/17 0457) Weight:  [122.97 kg (271 lb 1.6 oz)] 122.97 kg (271 lb 1.6 oz) (03/17 0457) Last BM Date: 12/16/14 PO not recorded. Good urine output, + BM Afebrile, VSS No labs Soft diet Intake/Output from previous day: 03/16 0701 - 03/17 0700 In: -  Out: 1800 [Urine:1800] Intake/Output this shift:    General appearance: alert, cooperative and no distress GI: soft, non-tender; bowel sounds normal; no masses,  no organomegaly  Lab Results:   Recent Labs  12/16/14 0500 12/17/14 0615  WBC 6.6 5.8  HGB 10.7* 10.4*  HCT 32.7* 31.7*  PLT 362 405*    BMET  Recent Labs  12/16/14 0500 12/17/14 0615  NA 138 139  K 4.1 3.9  CL 104 101  CO2 24 26  GLUCOSE 90 99  BUN <5* <5*  CREATININE 0.76 0.75  CALCIUM 8.6 8.8   PT/INR No results for input(s): LABPROT, INR in the last 72 hours.   Recent Labs Lab 12/12/14 1357 12/13/14 0448  AST 25 15  ALT 16 13  ALKPHOS 92 78  BILITOT 0.4 0.4  PROT 8.3 7.0  ALBUMIN 3.9 3.2*     Lipase     Component Value Date/Time   LIPASE 14 12/12/2014 1357     Studies/Results: No results found.  Medications: . amoxicillin-clavulanate  1 tablet Oral BID  . enoxaparin (LOVENOX) injection  60 mg Subcutaneous Q24H  . saccharomyces boulardii  250 mg Oral BID    Assessment/Plan Terminal ileitis with phlegmon & Lymphadenopathy No prior history of inflammatory disease 4.5 days of Zosyn so far. Hx of anemia Hx of endometriosis/fibroids Lovenox/SCD for DVT prophylaxis  Plan:  For discharge home today, follow up with surgery as needed.      LOS: 6 days    Algenis Ballin 12/18/2014

## 2014-12-18 NOTE — Progress Notes (Signed)
Virginia Olson 8:41 AM  Subjective: Patient is doing well without any complaints and tolerated diet yesterday  Objective: Vital signs stable afebrile no acute distress abdomen is soft nontender white count okay  Assessment: Improved  Plan: I believe she can go home today and please set up an outpatient CAT scan tomorrow and have them call me with the results and then I will either set up IR drainage or follow-up in my office next week and probably continue antibiotics for at least 2 weeks pending above and she was instructed on a low residue diet and to call me sooner when necessary  [facility] E

## 2014-12-19 ENCOUNTER — Other Ambulatory Visit: Payer: Self-pay | Admitting: Gastroenterology

## 2014-12-19 DIAGNOSIS — R9389 Abnormal findings on diagnostic imaging of other specified body structures: Secondary | ICD-10-CM

## 2014-12-19 LAB — CULTURE, BLOOD (ROUTINE X 2)
Culture: NO GROWTH
Culture: NO GROWTH

## 2014-12-21 NOTE — Discharge Summary (Signed)
Physician Discharge Summary  Virginia Olson JOA:[id number] DOB: 1976-04-03 DOA: 12/12/2014  PCP: Antony Blackbird, MD  Admit date: 12/12/2014 Discharge date: 12/21/2014  Time spent: 35 minutes  Recommendations for Outpatient Follow-up:  1. Please follow-up on CBC and BMP on hospital follow-up visit   Discharge Diagnoses:  Principal Problem:   Ileitis Active Problems:   Abdominal pain   Nausea and vomiting   Hypokalemia   Anemia   Fibroids   Discharge Condition: Stable  Diet recommendation: Heart healthy  Filed Weights   12/16/14 0613 12/17/14 0459 12/18/14 0457  Weight: 121.927 kg (268 lb 12.8 oz) 123.288 kg (271 lb 12.8 oz) 122.97 kg (271 lb 1.6 oz)    History of present illness:  Virginia Olson is a 39 y.o. female  With history of anemia and endometriosis who presents to the ED complaining of diffuse abdominal pain, nausea and vomiting that have been ongoing times a week. Patient stated that symptoms started 1 week prior to admission with diffuse abdominal pain which alternated between sharp and all with some associated nausea and emesis. Patient presented to the emergency room while with prior to admission was diagnosed with a possible ileitis per CT scan and sent home on anti-medics, and pain medication. Patient stated that continue to have intermittent pain and presented back to the ED 2 days prior to admission with similar symptoms. Patient stated that at that time she was diagnosed with a urinary tract infection placed on some antibiotics and sent home on pain medication. Patient states 1 day prior to admission was doing okay. On the morning of admission patient stated that started to have significant abdominal pain constant in nature with nausea and emesis mostly bilious and dry heaves. Patient endorses some chills and generalized weakness. Patient states hadn't had a bowel movement in approximately 2 days. Patient denies any fevers, no shortness of breath, no diarrhea, no  dysuria, no cough, no melena, no hematochezia, no hematemesis. Patient does endorse some chest pain associated with a dry heaves. Patient denies any recent travel. Patient does not drink any well water. No change in her diet. Patient denies any family history of inflammatory bowel disease.  Hospital Course:  Patient is a pleasant 39 year old female with a past medical history of endometriosis who was admitted to the medicine service on 12/12/2014, presenting with complaints of abdominal pain that was associated with nausea and vomiting. She was worked up with a CT scan of abdomen and pelvis with contrast that revealed a new 4.2 x 4.2 x 1.7 cm collection of fluid at the right lower quadrant adjacent to the terminal ileum and cecum. Findings suspicious for evolving abscess. Liver findings consistent with terminal ileitis. She was started on empiric IV antibiotic therapy with ciprofloxacin and Flagyl as well as systemic steroids. General surgery consultation was place as patient was seen by Dr. Marcello Moores. Patient was also evaluated by Dr. Watt Climes of gastroenterology  who recommended continuing antibiotics, steroids, and having patient undergo colonoscopy in 1-2 months . Patient showed gradual clinical improvement as her diet was slowly advanced. By 12/17/2014 she was tolerating regular diet. IV antimicrobial therapy was stopped and she was transitioned to Augmentin 807 5 mg by mouth twice a day. She was felt to be stable for discharge from a GI and general surgery standpoint. Hospital follow-up set up with with GI and surgery.   Consultations:  GI   General surgery  Discharge Exam: Filed Vitals:   12/18/14 0457  BP: 106/57  Pulse: 93  Temp: 98.2  F (36.8 C)  Resp: 16    General: Patient reports feeling better, tolerating by mouth intake  Cardiovascular: regular rate and rhythm normal S1-S2 no murmurs or gallops  Respiratory: normal respiratory effort  Abdomen: Soft nontender nondistended positive  bowel sounds  Discharge Instructions   Discharge Instructions    Call MD for:  difficulty breathing, headache or visual disturbances    Complete by:  As directed      Call MD for:  extreme fatigue    Complete by:  As directed      Call MD for:  hives    Complete by:  As directed      Call MD for:  persistant dizziness or light-headedness    Complete by:  As directed      Call MD for:  persistant nausea and vomiting    Complete by:  As directed      Call MD for:  redness, tenderness, or signs of infection (pain, swelling, redness, odor or green/yellow discharge around incision site)    Complete by:  As directed      Call MD for:  severe uncontrolled pain    Complete by:  As directed      Call MD for:  temperature >100.4    Complete by:  As directed      Diet - low sodium heart healthy    Complete by:  As directed      Increase activity slowly    Complete by:  As directed           Discharge Medication List as of 12/18/2014  9:42 AM    START taking these medications   Details  amoxicillin-clavulanate (AUGMENTIN) 875-125 MG per tablet Take 1 tablet by mouth 2 (two) times daily., Starting 12/18/2014, Until Discontinued, Print    polyethylene glycol (MIRALAX / GLYCOLAX) packet Take 17 g by mouth daily as needed for mild constipation., Starting 12/18/2014, Until Discontinued, Normal    saccharomyces boulardii (FLORASTOR) 250 MG capsule Take 1 capsule (250 mg total) by mouth 2 (two) times daily., Starting 12/18/2014, Until Discontinued, Normal    sorbitol 70 % SOLN Take 30 mLs by mouth daily as needed for moderate constipation., Starting 12/18/2014, Until Discontinued, Print      CONTINUE these medications which have NOT CHANGED   Details  acetaminophen (TYLENOL) 500 MG tablet Take 1,000 mg by mouth every 6 (six) hours as needed for moderate pain (pain)., Until Discontinued, Historical Med    ondansetron (ZOFRAN ODT) 4 MG disintegrating tablet 4mg  ODT q4 hours prn nausea/vomit,  Print    oxyCODONE-acetaminophen (PERCOCET) 5-325 MG per tablet Take 1-2 tablets by mouth every 6 (six) hours as needed., Starting 12/11/2014, Until Discontinued, Print      STOP taking these medications     cephALEXin (KEFLEX) 500 MG capsule      ondansetron (ZOFRAN) 4 MG tablet        No Known Allergies Follow-up Information    Follow up with Edward Jolly, MD.   Specialty:  General Surgery   Why:  As needed   Contact information:   [address] [place] [address] [phone]       Follow up with [facility] E, MD In 2 weeks.   Specialty:  Gastroenterology   Contact information:   [address] [place] [address] [phone]       Follow up with FULP, CAMMIE, MD In 1 week.   Specialty:  Family Medicine  Contact information:   [address] [place] [address] [phone]        The results of significant diagnostics from this hospitalization (including imaging, microbiology, ancillary and laboratory) are listed below for reference.    Significant Diagnostic Studies: Ct Abdomen Pelvis W Contrast  12/12/2014   CLINICAL DATA:  Acute onset of generalized abdominal pain, nausea and vomiting. Initial encounter.  EXAM: CT ABDOMEN AND PELVIS WITH CONTRAST  TECHNIQUE: Multidetector CT imaging of the abdomen and pelvis was performed using the standard protocol following bolus administration of intravenous contrast.  CONTRAST:  168mL OMNIPAQUE IOHEXOL 300 MG/ML  SOLN  COMPARISON:  CT of the abdomen and pelvis performed 12/05/2014  FINDINGS: The visualized lung bases are clear.  The liver and spleen are unremarkable in appearance. The gallbladder is within normal limits. The pancreas and adrenal glands are unremarkable.  The kidneys are unremarkable in appearance. There is no evidence of hydronephrosis. No renal or ureteral stones are seen. No perinephric stranding is appreciated.  The small bowel is unremarkable in appearance. The  stomach is within normal limits. No acute vascular abnormalities are seen.  There is a new vague collection of fluid measuring approximately 4.2 x 4.2 x 1.7 cm at the right lower quadrant adjacent to the terminal ileum and cecum, with minimal peripheral enhancement and surrounding phlegmon. This is suspicious for evolving abscess. Surrounding pericecal lymphadenopathy is seen. A few tiny foci of free air are seen medially, concerning for contained perforation.  The appendix is difficult to fully characterize, but appears grossly unremarkable, on comparison with the prior study, seen more inferiorly at the right lower quadrant. There is minimal nonspecific haziness along the left paracolic gutter, new from the prior study. Mild inflammation is suggested along the ascending colon. Mild scattered diverticulosis is noted along the transverse and sigmoid colon.  The bladder is mildly distended and grossly unremarkable. A small urachal remnant is incidentally seen. The uterus is grossly unremarkable appearance. An intrauterine device is noted expected position at the fundus of the uterus. A 2.6 cm right adnexal cystic focus is likely physiologic in nature. The ovaries are otherwise unremarkable. No inguinal lymphadenopathy is seen.  No acute osseous abnormalities are identified.  IMPRESSION: 1. New vague 4.2 x 4.2 x 1.7 cm collection of fluid at the right lower quadrant, adjacent to the terminal ileum and cecum, with minimal peripheral enhancement and surrounding phlegmon. This is suspicious for evolving abscess. Few tiny foci of free air seen medially, concerning for contained perforation. Findings raise concern for worsening terminal ileitis, possibly reflecting Crohn's disease or infectious ileitis. 2. Surrounding pericecal lymphadenopathy seen. 3. Mild inflammation suggested along the ascending colon. Minimal nonspecific haziness along the left paracolic gutter, new from the prior study. 4. Mild scattered  diverticulosis along the transverse and sigmoid colon, without evidence of diverticulitis.  These results were called by telephone at the time of interpretation on 12/12/2014 at 6:25 pm to [facility], who verbally acknowledged these results.   Electronically Signed   By: Garald Balding M.D.   On: 12/12/2014 18:27   Ct Abdomen Pelvis W Contrast  12/05/2014   CLINICAL DATA:  Two day history of right lower quadrant pain and dysuria  EXAM: CT ABDOMEN AND PELVIS WITH CONTRAST  TECHNIQUE: Multidetector CT imaging of the abdomen and pelvis was performed using the standard protocol following bolus administration of intravenous contrast. Oral contrast was also administered.  CONTRAST:  122mL OMNIPAQUE IOHEXOL 300 MG/ML SOLN, 39mL OMNIPAQUE IOHEXOL 300 MG/ML  SOLN  COMPARISON:  None.  FINDINGS: There is atelectatic change in the left lung base. Lung bases are otherwise clear.  Liver is prominent, measuring 21.4 cm in length. There is hepatic steatosis. No focal liver lesions are identified. The gallbladder wall is not thickened. There is no biliary duct dilatation.  Spleen, pancreas, and adrenals appear normal. Kidneys bilaterally show no mass or hydronephrosis on either side. There is no renal or ureteral calculus on either side.  In the pelvis, the urinary bladder is midline with normal wall thickness. There is an intrauterine device within the endometrium. The rectum is slightly distended with stool. There is no pelvic or adnexal mass.  There is thickening of the wall of the terminal ileum with extensive surrounding inflammation. Several small lymph nodes in this area are noted, likely inflammatory in etiology. The nearby appendix is normal in size and contour without appreciable appendiceal wall thickening.  There is a small ventral hernia containing only fat.  There is no bowel obstruction.  No free air or portal venous air.  There is no ascites or abscess in the abdomen or pelvis. Small lymph nodes in the right  abdomen are noted. By size criteria, there is no frank adenopathy in the abdomen or pelvis.  Aorta is nonaneurysmal.  There are no blastic or lytic bone lesions.  IMPRESSION: Evidence of terminal ileitis with surrounding mesenteric inflammation and several small surrounding lymph nodes. The remainder of the bowel appears unremarkable without appreciable wall thickening. No bowel obstruction. No frank abscess. Nearby appendix appears unremarkable. Differential considerations for this appearance of the terminal ileum include Crohn's disease and infectious ileitis.  Liver is prominent with hepatic steatosis.  Small ventral hernia containing only fat.  No renal or ureteral calculus.  No hydronephrosis.  Intrauterine device positioned within the endometrium.   Electronically Signed   By: Lowella Grip III M.D.   On: 12/05/2014 11:07   Dg Chest Port 1 View  12/12/2014   CLINICAL DATA:  Abdominal pain and vomiting.  Syncope.  EXAM: PORTABLE CHEST - 1 VIEW  COMPARISON:  None.  FINDINGS: The heart size and mediastinal contours are within normal limits. Both lungs are clear. The visualized skeletal structures are unremarkable.  IMPRESSION: No active disease.   Electronically Signed   By: Kerby Moors M.D.   On: 12/12/2014 18:16    Microbiology: Recent Results (from the past 240 hour(s))  Culture, blood (routine x 2)     Status: None   Collection Time: 12/12/14  6:50 PM  Result Value Ref Range Status   Specimen Description BLOOD RIGHT HAND  10 ML IN [facility] ONLY  Final   Special Requests NONE  Final   Culture   Final    NO GROWTH 5 DAYS Performed at [facility]    Report Status 12/19/2014 FINAL  Final  Culture, blood (routine x 2)     Status: None   Collection Time: 12/12/14  7:00 PM  Result Value Ref Range Status   Specimen Description BLOOD RIGHT ARM  10 ML IN [facility] BOTTLE  Final   Special Requests NONE  Final   Culture   Final    NO GROWTH 5 DAYS Performed at [facility]     Report Status 12/19/2014 FINAL  Final     Labs: Basic Metabolic Panel:  Recent Labs Lab 12/15/14 0456 12/16/14 0500 12/17/14 0615  NA 133* 138 139  K 4.2 4.1 3.9  CL 101 104 101  CO2 24 24  26  GLUCOSE 98 90 99  BUN <5* <5* <5*  CREATININE 0.69 0.76 0.75  CALCIUM 8.5 8.6 8.8   Liver Function Tests: No results for input(s): AST, ALT, ALKPHOS, BILITOT, PROT, ALBUMIN in the last 168 hours. No results for input(s): LIPASE, AMYLASE in the last 168 hours. No results for input(s): AMMONIA in the last 168 hours. CBC:  Recent Labs Lab 12/15/14 0456 12/16/14 0500 12/17/14 0615  WBC 10.3 6.6 5.8  NEUTROABS 7.9* 3.4 2.5  HGB 11.0* 10.7* 10.4*  HCT 33.9* 32.7* 31.7*  MCV 85.2 84.9 84.3  PLT 427* 362 405*   Cardiac Enzymes: No results for input(s): CKTOTAL, CKMB, CKMBINDEX, TROPONINI in the last 168 hours. BNP: BNP (last 3 results) No results for input(s): BNP in the last 8760 hours.  ProBNP (last 3 results) No results for input(s): PROBNP in the last 8760 hours.  CBG: No results for input(s): GLUCAP in the last 168 hours.     SignedKelvin Cellar  [facility] 12/21/2014, 3:54 PM

## 2014-12-22 ENCOUNTER — Ambulatory Visit
Admission: RE | Admit: 2014-12-22 | Discharge: 2014-12-22 | Disposition: A | Discharge status: Home / Self Care | Payer: 59 | Source: Ambulatory Visit | Attending: Gastroenterology | Admitting: Gastroenterology

## 2014-12-22 DIAGNOSIS — R9389 Abnormal findings on diagnostic imaging of other specified body structures: Secondary | ICD-10-CM

## 2014-12-22 MED ORDER — IOPAMIDOL (ISOVUE-300) INJECTION 61%
125.0000 mL | Freq: Once | INTRAVENOUS | Status: AC | PRN
  Administered 2014-12-22: 125 mL via INTRAVENOUS

## 2015-02-18 ENCOUNTER — Other Ambulatory Visit (HOSPITAL_COMMUNITY)
Admission: RE | Admit: 2015-02-18 | Discharge: 2015-02-18 | Disposition: A | Discharge status: Home / Self Care | Payer: 59 | Source: Ambulatory Visit | Attending: Obstetrics and Gynecology | Admitting: Obstetrics and Gynecology

## 2015-02-18 ENCOUNTER — Other Ambulatory Visit: Payer: Self-pay | Admitting: Obstetrics and Gynecology

## 2015-02-18 DIAGNOSIS — Z01419 Encounter for gynecological examination (general) (routine) without abnormal findings: Secondary | ICD-10-CM | POA: Insufficient documentation

## 2015-02-18 DIAGNOSIS — Z1151 Encounter for screening for human papillomavirus (HPV): Secondary | ICD-10-CM | POA: Diagnosis present

## 2015-02-20 LAB — CYTOLOGY - PAP

## 2015-03-24 ENCOUNTER — Other Ambulatory Visit: Payer: Self-pay | Admitting: Obstetrics and Gynecology

## 2015-03-25 ENCOUNTER — Encounter (INDEPENDENT_AMBULATORY_CARE_PROVIDER_SITE_OTHER): Payer: Self-pay

## 2015-03-25 ENCOUNTER — Encounter (HOSPITAL_COMMUNITY): Payer: Self-pay

## 2015-03-25 ENCOUNTER — Encounter (HOSPITAL_COMMUNITY)
Admission: RE | Admit: 2015-03-25 | Discharge: 2015-03-25 | Disposition: A | Discharge status: Home / Self Care | Payer: 59 | Source: Ambulatory Visit | Attending: Obstetrics and Gynecology | Admitting: Obstetrics and Gynecology

## 2015-03-25 DIAGNOSIS — Z01818 Encounter for other preprocedural examination: Secondary | ICD-10-CM | POA: Insufficient documentation

## 2015-03-25 LAB — ABO/RH: ABO/RH(D): O POS

## 2015-03-25 LAB — CBC
HEMATOCRIT: 35.4 % — AB (ref 36.0–46.0)
Hemoglobin: 11.9 g/dL — ABNORMAL LOW (ref 12.0–15.0)
MCH: 28.6 pg (ref 26.0–34.0)
MCHC: 33.6 g/dL (ref 30.0–36.0)
MCV: 85.1 fL (ref 78.0–100.0)
PLATELETS: 283 10*3/uL (ref 150–400)
RBC: 4.16 MIL/uL (ref 3.87–5.11)
RDW: 15.4 % (ref 11.5–15.5)
WBC: 5.2 10*3/uL (ref 4.0–10.5)

## 2015-03-25 LAB — TYPE AND SCREEN
ABO/RH(D): O POS
Antibody Screen: NEGATIVE

## 2015-03-25 NOTE — Patient Instructions (Signed)
Your procedure is scheduled on:04/15/15  Enter through the Main Entrance at :70 am Pick up desk phone and dial [phone] and inform us of your arrival.  Please call [phone] if you have any problems the morning of surgery.  Remember: Do not eat food after midnight:Tuesday Clear liquids are ok until:9am on WED  You may brush your teeth the morning of surgery.  DO NOT wear jewelry, eye make-up, lipstick,body lotion, or dark fingernail polish.  (Polished toes are ok) You may wear deodorant.  If you are to be admitted after surgery, leave suitcase in car until your room has been assigned. Patients discharged on the day of surgery will not be allowed to drive home. Wear loose fitting, comfortable clothes for your ride home.

## 2015-04-07 ENCOUNTER — Other Ambulatory Visit: Payer: Self-pay | Admitting: Obstetrics and Gynecology

## 2015-04-07 NOTE — H&P (Signed)
Chief Complaint(s):   PreOp History and Physical for surgery 04/15/15   HPI:  General 39 y/o presents for preop history and physical examinationin preparation for robotic robotic hysterectomy. she is a patient of Dr. Grier Rocher. she has a h/o of endometriosis since she was a teenager. she has been on nuvaring/ nexplanon / ocps without control of endometriosus . she has a [place] currently.  she got sick in march. she was seen at the ER and diagnosed with right lower quadrant abscess. she was placed on antibiotics and improved.  she has had a colonoscopy that was negative. Her GI physician is Dr. Watt Climes. Her last menses was May 11 , 2016. she describes it as very heavy. she changed a super plus tampon every hour on her heaviest day. she reports 4 days out of 7 that are heavy. she has had hygiene accidents. she does not desire future fertility .  she had an ultrasound 09/2014 that showed a 9.73 cm x 5.4 cm x 6.4 cm uterus with multiple fibroids the largest is 2.7 cm.  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 Periods : irregular. Denies LMP 04/07/15. Birth control BTL, Mirena IUD. Last pap smear date 02/18/15. Denies Last mammogram date. Denies Abnormal pap smear. Denies STD.   OB History:   Number of pregnancies 4. Pregnancy # 1 live birth, vaginal delivery. Pregnancy # 2 live birth, vaginal delivery. Pregnancy # 3 live birth, vaginal delivery. Pregnancy #  4: live birth, vaginal delivery.   Surgical History:   Laprascopy 1990     Postpartum BTL 2006   Hospitalization:   childbirth x 4     Abdominal pain 12/2014     Hospital stay x 1 week for abdominal pain 12/12/14   Family History:   Father: deceased, diagnosed with DM    Mother: alive, A + W   denies any GYN family cancer hx No family hx of colon cancer/polyps or liver disease.  Social History:  General Tobacco use cigarettes: Never smoked, Tobacco history last updated 04/07/2015.  no Smoking.  Alcohol: yes, occasionally, wine.  no Recreational drug use.  Exercise: yes, 5 x weekly.  Occupation: employed, Catering manager .  Marital Status: married.  Children: Boys, 3, girls, 1.  ROS: CONSTITUTIONAL none" options="no,yes" propid="91" itemid="[id number]" categoryid="10464" encounterid="[id number]"Fatigue none. none today" options="no,yes" propid="91" itemid="10467" categoryid="10464" encounterid="[id number]"Fever none today.  SKIN no" options="no,yes" propid="91" itemid="[id number]" categoryid="202750" encounterid="[id number]"Rash no. no" options="no,yes" propid="91" itemid="202757" categoryid="202750" encounterid="[id number]"Suspicious lesions no.  CARDIOLOGY none" options="no,yes" propid="91" itemid="[id number]" categoryid="10488" encounterid="[id number]"Chest pain none.  RESPIRATORY no" options="no" propid="91" itemid="270013" categoryid="138132" encounterid="[id number]"Shortness of breath no. no" options="no,yes" propid="91" itemid="172745" categoryid="138132" encounterid="[id number]"Cough no.  GASTROENTEROLOGY none" options="no,yes" propid="91" itemid="193447" categoryid="10494" encounterid="[id number]"Appetite change none. no" options="no,yes" propid="91" itemid="193449" categoryid="10494" encounterid="[id number]"Change in bowel habits no.  FEMALE REPRODUCTIVE no" options="no,yes" propid="91" itemid="196298" categoryid="10525" encounterid="[id number]"Breast lumps or discharge no. none" options="no,yes"  propid="91" itemid="186083" categoryid="10525" encounterid="[id number]"Breast pain none. none" options="no,yes" propid="91" itemid="138198" categoryid="10525" encounterid="[id number]"Dyspareunia  none. no" options="no,yes" propid="91" itemid="202654" categoryid="10525" encounterid="[id number]"Dysuria no. none" options="no,yes" propid="91" itemid="186082" categoryid="10525" encounterid="[id number]"Pelvic pain none. yes" options="no,yes" propid="91" itemid="199173" categoryid="10525" encounterid="[id number]"Regular menses yes. no" options="no,yes" propid="91" itemid="278230" categoryid="10525" encounterid="[id number]"Unusual vaginal discharge no. no" options="no,yes" propid="91" itemid="278942" categoryid="10525" encounterid="[id number]"Vaginal itching no. no" options="no,yes" propid="91" itemid="278837" categoryid="10525" encounterid="[id number]"Vulvar/labial lesion no.  NEUROLOGY none" options="no,yes" propid="91" itemid="193627" categoryid="12512" encounterid="[id number]"Migraines none. none" options="no,yes" propid="91" itemid="12514" categoryid="12512" encounterid="[id number]"Tingling/numbness none. none" options="no,yes" propid="91" itemid="193467" categoryid="12512" encounterid="[id number]"Visual changes none.  PSYCHOLOGY no" options="" propid="91" itemid="275919" categoryid="10520" encounterid="[id number]"Depression no.  ENDOCRINOLOGY none" options="no,yes" propid="91" itemid="202624" categoryid="12508" encounterid="[id number]"Hot flashes none. no unintentional" options="no,yes" propid="91" itemid="193436" categoryid="12508" encounterid="[id number]"Weight gain no unintentional. none" options="no,yes" propid="91" itemid="138164" categoryid="12508" encounterid="[id number]"Weight loss none.  HEMATOLOGY/LYMPH no" options="no,yes" propid="91" itemid="193454" categoryid="138157" encounterid="[id number]"Anemia no.    Objective:  Vitals:  Wt 288, Wt change 12 lb, Ht 68.75, BMI 42.84, Pulse sitting 81, BP sitting 126/82  Past Results:  Examination:    General Examination McCoy,Tiffany 04/07/2015 04:01:22 PM &gt; , for pelvic exam only" categoryPropId="[id number]" examid="[id number]"CHAPERONE PRESENT McCoy,Tiffany 04/07/2015 04:01:22 PM > , for pelvic exam only.  Physical Examination: GENERAL in NAD, pleasant"Patient appears in NAD, pleasant. well developed"Build: well developed. overweight"General Appearance: overweight.  NECK unremarkable, no lymphadenopathy"Cervical lymph nodes: unremarkable, no lymphadenopathy. normal"ROM: normal. no thyromegaly, non tender"Thyroid: no thyromegaly, non tender.  LUNGS clear to auscultation"Breath sounds: clear to auscultation. no"Dyspnea: no.  HEART none"Murmurs: none. normal"Rate: normal. regular"Rhythm: regular.  BREASTS no palpable masses, no lymphadenopathy, nontender"Axilla: no palpable masses, no lymphadenopathy, nontender. no masses, dimpling, retraction, or nipple discharge , bilaterally"Breast Mass: no masses, dimpling, retraction, or nipple discharge , bilaterally. unremarkable"Skin Change: unremarkable.  ABDOMEN no masses,tenderness,organomegaly, no CVAT"General: no masses,tenderness,organomegaly, no CVAT.  FEMALE GENITOURINARY no mass, non tender"Adnexa: no mass, non tender. normal, no lesions"Anus/perineum: normal, no lesions. normal appearance , no lesions/discharge/bleeding, , good pelvic support , external os normal .. IUD strings seen 1 cm in length "Cervix/ cuff: normal appearance , no lesions/discharge/bleeding, , good pelvic support , external os normal .. IUD strings seen 1 cm in length . normal, no lesions, no skin discoloration, no lymphadenopathy"External genitalia: normal, no lesions, no skin discoloration, no lymphadenopathy. deferred"Rectum: deferred. normal external meatus"Urethra: normal external meatus. normal size/shape/consistency, freely mobile, non tender"Uterus: normal size/shape/consistency, freely mobile, non tender. pink/moist mucosa, no lesions, no abnormal discharge,  odorless"Vagina: pink/moist mucosa, no lesions, no abnormal discharge, odorless. normal, no lesions, no skin discoloration, non tender"Vulva: normal, no lesions, no skin discoloration, non tender.  EXTREMITIES FROM of all extremities"Extremities FROM of all extremities.  NEUROLOGICAL normal"Gait: normal. alert and oriented x 3"Orientation: alert and oriented x 3.    Assessment:  Assessment:  Menorrhagia - N92.0 (Primary), Despite IUD in place, the patient still has heavy menses.     Right lower quadrant abscess - K65.1, Resolved.     Endometriosis - N80.9     Fibroids - D25.9     Prediabetes - R73.09     Plan:  Treatment:  Menorrhagia  Notes: planning for definitive therapy via robotic assisted laparoscopic hysterectomy with bilateral salpingectomy and right oophorectomy. possible left oophorectomy possible laporotomy.. r/b/a of surgery were discussed with the patient including r/o infection/ bleeding damage to bowel bladder ureters and surrounding organs with the need for further surgery. r/o Transfusion HIV/ HEp B&C discussed. r/o conversion to laparotomy... she voiced understanding and desires to proceed.  Endometriosis  Notes: planning for definitive therapy via robotic assisted laparoscopic hysterectomy with bilateral salpingectomy and right oophorectomy. possible left oophorectomy possible laporotomy.. r/b/a of surgery were discussed with the patient including r/o infection/ bleeding damage to bowel  bladder ureters and surrounding organs with the need for further surgery. r/o Transfusion HIV/ HEp B&C discussed. r/o conversion to laparotomy... she voiced understanding and desires to proceed.  Fibroids  Notes: planning for definitive therapy via robotic assisted laparoscopic hysterectomy with bilateral salpingectomy and right oophorectomy. possible left oophorectomy possible laporotomy.. r/b/a of surgery were discussed with the patient including r/o infection/ bleeding damage to bowel  bladder ureters and surrounding organs with the need for further surgery. r/o Transfusion HIV/ HEp B&C discussed. r/o conversion to laparotomy... she voiced understanding and desires to proceed.  Prediabetes  MZT:[id number] A1c  LEZ:[id number]

## 2015-04-12 ENCOUNTER — Encounter (HOSPITAL_COMMUNITY): Payer: Self-pay | Admitting: Anesthesiology

## 2015-04-12 NOTE — Anesthesia Preprocedure Evaluation (Addendum)
Anesthesia Evaluation  Patient identified by MRN, date of birth, ID band Patient awake    Reviewed: Allergy & Precautions, NPO status , Patient's Chart, lab work & pertinent test results  Airway Mallampati: III  TM Distance: >3 FB Neck ROM: Full    Dental no notable dental hx. (+) Teeth Intact   Pulmonary neg pulmonary ROS,  breath sounds clear to auscultation  Pulmonary exam normal       Cardiovascular negative cardio ROS Normal cardiovascular examRhythm:Regular Rate:Normal     Neuro/Psych negative neurological ROS  negative psych ROS   GI/Hepatic negative GI ROS, Neg liver ROS,   Endo/Other  Morbid obesity  Renal/GU negative Renal ROS  negative genitourinary   Musculoskeletal   Abdominal (+) + obese,   Peds  Hematology  (+) anemia ,   Anesthesia Other Findings   Reproductive/Obstetrics RLQ pain Fibroid uterus Endometriosis                           Anesthesia Physical Anesthesia Plan  ASA: III  Anesthesia Plan: General   Post-op Pain Management:    Induction: Intravenous  Airway Management Planned: Oral ETT  Additional Equipment:   Intra-op Plan:   Post-operative Plan: Extubation in OR  Informed Consent: I have reviewed the patients History and Physical, chart, labs and discussed the procedure including the risks, benefits and alternatives for the proposed anesthesia with the patient or authorized representative who has indicated his/her understanding and acceptance.   Dental advisory given  Plan Discussed with: Anesthesiologist, CRNA and Surgeon  Anesthesia Plan Comments:        Anesthesia Quick Evaluation

## 2015-04-14 MED ORDER — CEFOTETAN DISODIUM 2 G IJ SOLR
2.0000 g | INTRAMUSCULAR | Status: AC
  Administered 2015-04-15: 2 g via INTRAVENOUS
  Filled 2015-04-14: qty 2

## 2015-04-15 ENCOUNTER — Encounter (HOSPITAL_COMMUNITY)
Admission: RE | Disposition: A | Discharge status: Home / Self Care | Payer: Self-pay | Source: Ambulatory Visit | Attending: Obstetrics and Gynecology

## 2015-04-15 ENCOUNTER — Ambulatory Visit (HOSPITAL_COMMUNITY): Payer: 59 | Admitting: Anesthesiology

## 2015-04-15 ENCOUNTER — Observation Stay (HOSPITAL_COMMUNITY)
Admission: RE | Admit: 2015-04-15 | Discharge: 2015-04-16 | Disposition: A | Discharge status: Home / Self Care | Payer: 59 | Source: Ambulatory Visit | Attending: Obstetrics and Gynecology | Admitting: Obstetrics and Gynecology

## 2015-04-15 DIAGNOSIS — Z23 Encounter for immunization: Secondary | ICD-10-CM | POA: Diagnosis not present

## 2015-04-15 DIAGNOSIS — D251 Intramural leiomyoma of uterus: Secondary | ICD-10-CM | POA: Diagnosis not present

## 2015-04-15 DIAGNOSIS — D649 Anemia, unspecified: Secondary | ICD-10-CM | POA: Diagnosis not present

## 2015-04-15 DIAGNOSIS — N801 Endometriosis of ovary: Secondary | ICD-10-CM | POA: Insufficient documentation

## 2015-04-15 DIAGNOSIS — Z90710 Acquired absence of both cervix and uterus: Secondary | ICD-10-CM | POA: Diagnosis present

## 2015-04-15 DIAGNOSIS — Z79899 Other long term (current) drug therapy: Secondary | ICD-10-CM | POA: Insufficient documentation

## 2015-04-15 DIAGNOSIS — N802 Endometriosis of fallopian tube: Secondary | ICD-10-CM | POA: Diagnosis not present

## 2015-04-15 DIAGNOSIS — R1031 Right lower quadrant pain: Secondary | ICD-10-CM | POA: Insufficient documentation

## 2015-04-15 DIAGNOSIS — N920 Excessive and frequent menstruation with regular cycle: Secondary | ICD-10-CM | POA: Insufficient documentation

## 2015-04-15 DIAGNOSIS — N800 Endometriosis of uterus: Secondary | ICD-10-CM | POA: Insufficient documentation

## 2015-04-15 DIAGNOSIS — Z6841 Body Mass Index (BMI) 40.0 and over, adult: Secondary | ICD-10-CM | POA: Insufficient documentation

## 2015-04-15 DIAGNOSIS — N830 Follicular cyst of ovary: Secondary | ICD-10-CM | POA: Insufficient documentation

## 2015-04-15 DIAGNOSIS — R102 Pelvic and perineal pain: Secondary | ICD-10-CM | POA: Diagnosis not present

## 2015-04-15 HISTORY — PX: ROBOTIC ASSISTED TOTAL HYSTERECTOMY WITH BILATERAL SALPINGO OOPHERECTOMY: SHX6086

## 2015-04-15 HISTORY — PX: IUD REMOVAL: SHX5392

## 2015-04-15 LAB — TYPE AND SCREEN
ABO/RH(D): O POS
Antibody Screen: NEGATIVE

## 2015-04-15 LAB — BASIC METABOLIC PANEL
ANION GAP: 4 — AB (ref 5–15)
BUN: 9 mg/dL (ref 6–20)
CALCIUM: 8.7 mg/dL — AB (ref 8.9–10.3)
CO2: 24 mmol/L (ref 22–32)
Chloride: 105 mmol/L (ref 101–111)
Creatinine, Ser: 0.76 mg/dL (ref 0.44–1.00)
GFR calc Af Amer: >60 mL/min (ref >60)
GFR calc non Af Amer: >60 mL/min (ref >60)
GLUCOSE: 96 mg/dL (ref 65–99)
Potassium: 4.1 mmol/L (ref 3.5–5.1)
Sodium: 133 mmol/L — ABNORMAL LOW (ref 135–145)

## 2015-04-15 SURGERY — ROBOTIC ASSISTED TOTAL HYSTERECTOMY WITH BILATERAL SALPINGO OOPHORECTOMY
Anesthesia: General | Site: Vagina | Laterality: Right

## 2015-04-15 MED ORDER — CLINDAMYCIN PHOSPHATE 900 MG/50ML IV SOLN
900.0000 mg | INTRAVENOUS | Status: DC
  Filled 2015-04-15: qty 50

## 2015-04-15 MED ORDER — SODIUM CHLORIDE 0.9 % IV SOLN
INTRAVENOUS | Status: DC | PRN
  Administered 2015-04-15: 120 mL

## 2015-04-15 MED ORDER — LACTATED RINGERS IV SOLN
INTRAVENOUS | Status: DC
  Administered 2015-04-15 (×2): via INTRAVENOUS

## 2015-04-15 MED ORDER — BUDESONIDE-FORMOTEROL FUMARATE 160-4.5 MCG/ACT IN AERO: 2.0000 | INHALATION_SPRAY | Freq: Every day | RESPIRATORY_TRACT | Status: DC | PRN

## 2015-04-15 MED ORDER — HYDROMORPHONE HCL 1 MG/ML IJ SOLN
INTRAMUSCULAR | Status: AC
  Filled 2015-04-15: qty 1

## 2015-04-15 MED ORDER — LACTATED RINGERS IV SOLN
INTRAVENOUS | Status: DC
  Administered 2015-04-15 (×2): via INTRAVENOUS

## 2015-04-15 MED ORDER — ALUM & MAG HYDROXIDE-SIMETH 200-200-20 MG/5ML PO SUSP: 30.0000 mL | ORAL | Status: DC | PRN

## 2015-04-15 MED ORDER — DEXAMETHASONE SODIUM PHOSPHATE 10 MG/ML IJ SOLN
INTRAMUSCULAR | Status: DC | PRN
  Administered 2015-04-15: 10 mg via INTRAVENOUS

## 2015-04-15 MED ORDER — STERILE WATER FOR IRRIGATION IR SOLN
Status: DC | PRN
Start: 2015-04-15 — End: 2015-04-15
  Administered 2015-04-15: 3000 mL

## 2015-04-15 MED ORDER — NEOSTIGMINE METHYLSULFATE 10 MG/10ML IV SOLN
INTRAVENOUS | Status: AC
  Filled 2015-04-15: qty 1

## 2015-04-15 MED ORDER — MEPERIDINE HCL 25 MG/ML IJ SOLN: 6.2500 mg | INTRAMUSCULAR | Status: DC | PRN

## 2015-04-15 MED ORDER — SODIUM CHLORIDE 0.9 % IJ SOLN
INTRAMUSCULAR | Status: AC
  Filled 2015-04-15: qty 100

## 2015-04-15 MED ORDER — SCOPOLAMINE 1 MG/3DAYS TD PT72
1.0000 | MEDICATED_PATCH | TRANSDERMAL | Status: DC
Start: 2015-04-15 — End: 2015-04-15
  Administered 2015-04-15: 1.5 mg via TRANSDERMAL

## 2015-04-15 MED ORDER — KETOROLAC TROMETHAMINE 30 MG/ML IJ SOLN
INTRAMUSCULAR | Status: DC | PRN
  Administered 2015-04-15: 30 mg via INTRAVENOUS

## 2015-04-15 MED ORDER — GLYCOPYRROLATE 0.2 MG/ML IJ SOLN
INTRAMUSCULAR | Status: DC | PRN
  Administered 2015-04-15: .5 mg via INTRAVENOUS

## 2015-04-15 MED ORDER — ALBUTEROL SULFATE (2.5 MG/3ML) 0.083% IN NEBU: 3.0000 mL | INHALATION_SOLUTION | Freq: Four times a day (QID) | RESPIRATORY_TRACT | Status: DC | PRN

## 2015-04-15 MED ORDER — KETOROLAC TROMETHAMINE 30 MG/ML IJ SOLN
INTRAMUSCULAR | Status: AC
  Filled 2015-04-15: qty 1

## 2015-04-15 MED ORDER — ONDANSETRON HCL 4 MG/2ML IJ SOLN: 4.0000 mg | Freq: Four times a day (QID) | INTRAMUSCULAR | Status: DC | PRN

## 2015-04-15 MED ORDER — ROPIVACAINE HCL 5 MG/ML IJ SOLN
INTRAMUSCULAR | Status: AC
  Filled 2015-04-15: qty 60

## 2015-04-15 MED ORDER — KETOROLAC TROMETHAMINE 30 MG/ML IJ SOLN: 30.0000 mg | Freq: Four times a day (QID) | INTRAMUSCULAR | Status: DC

## 2015-04-15 MED ORDER — NEOSTIGMINE METHYLSULFATE 10 MG/10ML IV SOLN
INTRAVENOUS | Status: DC | PRN
  Administered 2015-04-15: 2.5 mg via INTRAVENOUS

## 2015-04-15 MED ORDER — PHENYLEPHRINE HCL 10 MG/ML IJ SOLN
INTRAMUSCULAR | Status: AC
  Filled 2015-04-15: qty 1

## 2015-04-15 MED ORDER — SCOPOLAMINE 1 MG/3DAYS TD PT72
MEDICATED_PATCH | TRANSDERMAL | Status: AC
  Filled 2015-04-15: qty 1

## 2015-04-15 MED ORDER — PHENYLEPHRINE HCL 10 MG/ML IJ SOLN
10.0000 mg | INTRAMUSCULAR | Status: DC | PRN
  Administered 2015-04-15: 10 ug/min via INTRAVENOUS

## 2015-04-15 MED ORDER — LIDOCAINE HCL (CARDIAC) 20 MG/ML IV SOLN
INTRAVENOUS | Status: DC | PRN
  Administered 2015-04-15: 40 mg via INTRAVENOUS

## 2015-04-15 MED ORDER — OXYCODONE-ACETAMINOPHEN 5-325 MG PO TABS
1.0000 | ORAL_TABLET | ORAL | Status: DC | PRN
  Administered 2015-04-15 – 2015-04-16 (×3): 1 via ORAL
  Filled 2015-04-15: qty 1
  Filled 2015-04-15: qty 2
  Filled 2015-04-15: qty 1

## 2015-04-15 MED ORDER — LACTATED RINGERS IR SOLN
Status: DC | PRN
  Administered 2015-04-15: 3000 mL

## 2015-04-15 MED ORDER — ONDANSETRON HCL 4 MG PO TABS: 4.0000 mg | ORAL_TABLET | Freq: Four times a day (QID) | ORAL | Status: DC | PRN

## 2015-04-15 MED ORDER — ARTIFICIAL TEARS OP OINT
TOPICAL_OINTMENT | OPHTHALMIC | Status: DC | PRN
  Administered 2015-04-15: 1 via OPHTHALMIC

## 2015-04-15 MED ORDER — PROPOFOL 10 MG/ML IV BOLUS
INTRAVENOUS | Status: DC | PRN
  Administered 2015-04-15: 180 mg via INTRAVENOUS

## 2015-04-15 MED ORDER — SUFENTANIL CITRATE 50 MCG/ML IV SOLN
INTRAVENOUS | Status: AC
Start: 2015-04-15 — End: 2015-04-15
  Filled 2015-04-15: qty 1

## 2015-04-15 MED ORDER — PANTOPRAZOLE SODIUM 40 MG PO TBEC
40.0000 mg | DELAYED_RELEASE_TABLET | Freq: Every day | ORAL | Status: DC
  Administered 2015-04-16: 40 mg via ORAL
  Filled 2015-04-15: qty 1

## 2015-04-15 MED ORDER — HYDROMORPHONE HCL 1 MG/ML IJ SOLN
0.2000 mg | INTRAMUSCULAR | Status: DC | PRN
  Administered 2015-04-15 (×2): 0.6 mg via INTRAVENOUS
  Filled 2015-04-15 (×2): qty 1

## 2015-04-15 MED ORDER — GLYCOPYRROLATE 0.2 MG/ML IJ SOLN
INTRAMUSCULAR | Status: AC
  Filled 2015-04-15: qty 4

## 2015-04-15 MED ORDER — ARTIFICIAL TEARS OP OINT
TOPICAL_OINTMENT | OPHTHALMIC | Status: AC
  Filled 2015-04-15: qty 3.5

## 2015-04-15 MED ORDER — SIMETHICONE 80 MG PO CHEW: 80.0000 mg | CHEWABLE_TABLET | Freq: Four times a day (QID) | ORAL | Status: DC | PRN

## 2015-04-15 MED ORDER — PNEUMOCOCCAL VAC POLYVALENT 25 MCG/0.5ML IJ INJ
0.5000 mL | INJECTION | INTRAMUSCULAR | Status: AC
  Administered 2015-04-16: 0.5 mL via INTRAMUSCULAR
  Filled 2015-04-15 (×2): qty 0.5

## 2015-04-15 MED ORDER — ROCURONIUM BROMIDE 100 MG/10ML IV SOLN
INTRAVENOUS | Status: DC | PRN
  Administered 2015-04-15: 50 mg via INTRAVENOUS
  Administered 2015-04-15 (×2): 10 mg via INTRAVENOUS
  Administered 2015-04-15: 20 mg via INTRAVENOUS
  Administered 2015-04-15 (×3): 10 mg via INTRAVENOUS

## 2015-04-15 MED ORDER — PROPOFOL 10 MG/ML IV BOLUS
INTRAVENOUS | Status: AC
Start: 2015-04-15 — End: 2015-04-15
  Filled 2015-04-15: qty 20

## 2015-04-15 MED ORDER — SUFENTANIL CITRATE 50 MCG/ML IV SOLN
INTRAVENOUS | Status: DC | PRN
  Administered 2015-04-15 (×5): 10 ug via INTRAVENOUS

## 2015-04-15 MED ORDER — DEXAMETHASONE SODIUM PHOSPHATE 10 MG/ML IJ SOLN
INTRAMUSCULAR | Status: AC
  Filled 2015-04-15: qty 1

## 2015-04-15 MED ORDER — ONDANSETRON HCL 4 MG/2ML IJ SOLN
INTRAMUSCULAR | Status: AC
  Filled 2015-04-15: qty 2

## 2015-04-15 MED ORDER — ONDANSETRON HCL 4 MG/2ML IJ SOLN
INTRAMUSCULAR | Status: DC | PRN
  Administered 2015-04-15: 4 mg via INTRAVENOUS

## 2015-04-15 MED ORDER — SODIUM CHLORIDE 0.9 % IJ SOLN
INTRAMUSCULAR | Status: AC
  Filled 2015-04-15: qty 10

## 2015-04-15 MED ORDER — METOCLOPRAMIDE HCL 5 MG/ML IJ SOLN: 10.0000 mg | Freq: Once | INTRAMUSCULAR | Status: DC | PRN

## 2015-04-15 MED ORDER — IBUPROFEN 800 MG PO TABS
800.0000 mg | ORAL_TABLET | Freq: Three times a day (TID) | ORAL | Status: DC | PRN
  Administered 2015-04-16: 800 mg via ORAL
  Filled 2015-04-15: qty 1

## 2015-04-15 MED ORDER — HYDROMORPHONE HCL 1 MG/ML IJ SOLN
0.2500 mg | INTRAMUSCULAR | Status: DC | PRN
  Administered 2015-04-15 (×2): 0.5 mg via INTRAVENOUS

## 2015-04-15 MED ORDER — METHYLENE BLUE 1 % INJ SOLN
INTRAMUSCULAR | Status: DC | PRN
  Administered 2015-04-15: 1 mL via SUBMUCOSAL

## 2015-04-15 MED ORDER — MIDAZOLAM HCL 5 MG/5ML IJ SOLN
INTRAMUSCULAR | Status: DC | PRN
  Administered 2015-04-15: 2 mg via INTRAVENOUS

## 2015-04-15 MED ORDER — CIPROFLOXACIN IN D5W 400 MG/200ML IV SOLN
400.0000 mg | INTRAVENOUS | Status: DC
  Filled 2015-04-15: qty 200

## 2015-04-15 MED ORDER — KETOROLAC TROMETHAMINE 30 MG/ML IJ SOLN
30.0000 mg | Freq: Four times a day (QID) | INTRAMUSCULAR | Status: DC
  Administered 2015-04-15 – 2015-04-16 (×3): 30 mg via INTRAVENOUS
  Filled 2015-04-15 (×3): qty 1

## 2015-04-15 MED ORDER — LIDOCAINE HCL (CARDIAC) 20 MG/ML IV SOLN
INTRAVENOUS | Status: AC
  Filled 2015-04-15: qty 5

## 2015-04-15 MED ORDER — METHYLENE BLUE 1 % INJ SOLN
INTRAMUSCULAR | Status: AC
  Filled 2015-04-15: qty 1

## 2015-04-15 MED ORDER — MIDAZOLAM HCL 2 MG/2ML IJ SOLN
INTRAMUSCULAR | Status: AC
  Filled 2015-04-15: qty 2

## 2015-04-15 MED ORDER — ROCURONIUM BROMIDE 100 MG/10ML IV SOLN
INTRAVENOUS | Status: AC
  Filled 2015-04-15: qty 1

## 2015-04-15 MED ORDER — SENNA 8.6 MG PO TABS
1.0000 | ORAL_TABLET | Freq: Two times a day (BID) | ORAL | Status: DC
  Administered 2015-04-15 – 2015-04-16 (×2): 8.6 mg via ORAL
  Filled 2015-04-15 (×5): qty 1

## 2015-04-15 SURGICAL SUPPLY — 60 items
BARRIER ADHS 3X4 INTERCEED (GAUZE/BANDAGES/DRESSINGS) ×6 IMPLANT
BENZOIN TINCTURE PRP APPL 2/3 (GAUZE/BANDAGES/DRESSINGS) ×3 IMPLANT
CATH FOLEY 3WAY  5CC 16FR (CATHETERS) ×1
CATH FOLEY 3WAY 5CC 16FR (CATHETERS) ×2 IMPLANT
CONT PATH 16OZ SNAP LID 3702 (MISCELLANEOUS) ×3 IMPLANT
COVER BACK TABLE 60X90IN (DRAPES) ×6 IMPLANT
COVER TIP SHEARS 8 DVNC (MISCELLANEOUS) ×2 IMPLANT
COVER TIP SHEARS 8MM DA VINCI (MISCELLANEOUS) ×1
DECANTER SPIKE VIAL GLASS SM (MISCELLANEOUS) ×12 IMPLANT
DILATOR CANAL MILEX (MISCELLANEOUS) ×3 IMPLANT
DRAPE WARM FLUID 44X44 (DRAPE) ×3 IMPLANT
DRSG COVADERM PLUS 2X2 (GAUZE/BANDAGES/DRESSINGS) ×12 IMPLANT
DRSG OPSITE POSTOP 3X4 (GAUZE/BANDAGES/DRESSINGS) ×3 IMPLANT
DURAPREP 26ML APPLICATOR (WOUND CARE) ×3 IMPLANT
ELECT REM PT RETURN 9FT ADLT (ELECTROSURGICAL) ×3
ELECTRODE REM PT RTRN 9FT ADLT (ELECTROSURGICAL) ×2 IMPLANT
GAUZE VASELINE 3X9 (GAUZE/BANDAGES/DRESSINGS) IMPLANT
GLOVE BIOGEL M 6.5 STRL (GLOVE) ×9 IMPLANT
GLOVE BIOGEL PI IND STRL 6.5 (GLOVE) ×2 IMPLANT
GLOVE BIOGEL PI IND STRL 7.0 (GLOVE) ×8 IMPLANT
GLOVE BIOGEL PI INDICATOR 6.5 (GLOVE) ×1
GLOVE BIOGEL PI INDICATOR 7.0 (GLOVE) ×4
KIT ACCESSORY DA VINCI DISP (KITS) ×1
KIT ACCESSORY DVNC DISP (KITS) ×2 IMPLANT
LEGGING LITHOTOMY PAIR STRL (DRAPES) ×3 IMPLANT
LIQUID BAND (GAUZE/BANDAGES/DRESSINGS) ×3 IMPLANT
OCCLUDER COLPOPNEUMO (BALLOONS) ×3 IMPLANT
PACK ROBOT WH (CUSTOM PROCEDURE TRAY) ×3 IMPLANT
PACK ROBOTIC GOWN (GOWN DISPOSABLE) ×3 IMPLANT
PAD POSITIONER PINK NONSTERILE (MISCELLANEOUS) ×3 IMPLANT
PAD PREP 24X48 CUFFED NSTRL (MISCELLANEOUS) ×6 IMPLANT
SET CYSTO W/LG BORE CLAMP LF (SET/KITS/TRAYS/PACK) ×3 IMPLANT
SET IRRIG TUBING LAPAROSCOPIC (IRRIGATION / IRRIGATOR) ×3 IMPLANT
SET TRI-LUMEN FLTR TB AIRSEAL (TUBING) ×3 IMPLANT
STRIP CLOSURE SKIN 1/4X4 (GAUZE/BANDAGES/DRESSINGS) ×3 IMPLANT
SUT VIC AB 0 CT1 27 (SUTURE) ×2
SUT VIC AB 0 CT1 27XBRD ANBCTR (SUTURE) ×4 IMPLANT
SUT VIC AB 0 CT1 27XBRD ANTBC (SUTURE) IMPLANT
SUT VICRYL 0 27 CT2 27 ABS (SUTURE) ×15 IMPLANT
SUT VICRYL 0 UR6 27IN ABS (SUTURE) ×3 IMPLANT
SUT VICRYL RAPIDE 4/0 PS 2 (SUTURE) ×6 IMPLANT
SUT VLOC 180 0 6IN GS21 (SUTURE) ×3 IMPLANT
SUT VLOC 180 0 9IN  GS21 (SUTURE) ×1
SUT VLOC 180 0 9IN GS21 (SUTURE) ×2 IMPLANT
SYR 50ML LL SCALE MARK (SYRINGE) ×6 IMPLANT
TIP RUMI ORANGE 6.7MMX12CM (TIP) IMPLANT
TIP UTERINE 5.1X6CM LAV DISP (MISCELLANEOUS) IMPLANT
TIP UTERINE 6.7X10CM GRN DISP (MISCELLANEOUS) IMPLANT
TIP UTERINE 6.7X6CM WHT DISP (MISCELLANEOUS) ×3 IMPLANT
TIP UTERINE 6.7X8CM BLUE DISP (MISCELLANEOUS) IMPLANT
TOWEL OR 17X24 6PK STRL BLUE (TOWEL DISPOSABLE) ×9 IMPLANT
TROCAR 12M 150ML BLUNT (TROCAR) ×3 IMPLANT
TROCAR DISP BLADELESS 8 DVNC (TROCAR) ×2 IMPLANT
TROCAR DISP BLADELESS 8MM (TROCAR) ×1
TROCAR PORT AIRSEAL 5X120 (TROCAR) ×3 IMPLANT
TROCAR PORT AIRSEAL 8X120 (TROCAR) IMPLANT
TROCAR XCEL 12X100 BLDLESS (ENDOMECHANICALS) IMPLANT
TROCAR XCEL NON-BLD 11X100MML (ENDOMECHANICALS) ×3 IMPLANT
WARMER LAPAROSCOPE (MISCELLANEOUS) ×3 IMPLANT
WATER STERILE IRR 1000ML POUR (IV SOLUTION) ×9 IMPLANT

## 2015-04-15 NOTE — Anesthesia Postprocedure Evaluation (Signed)
Anesthesia Post Note  Patient: Virginia Olson  Procedure(s) Performed: Procedure(s) (LRB): ROBOTIC ASSISTED TOTAL HYSTERECTOMY WITH BILATERAL SALPINGOECTOMY and RIGHT OOOPHORECTOMY (Right) INTRAUTERINE DEVICE (IUD) REMOVAL (N/A)  Anesthesia type: General  Patient location: PACU  Post pain: Pain level controlled  Post assessment: Post-op Vital signs reviewed  Last Vitals:  Filed Vitals:   04/15/15 1415  BP: 123/73  Pulse: 89  Temp:   Resp: 16    Post vital signs: Reviewed  Level of consciousness: sedated  Complications: No apparent anesthesia complications

## 2015-04-15 NOTE — Transfer of Care (Signed)
Immediate Anesthesia Transfer of Care Note  Patient: Virginia Olson  Procedure(s) Performed: Procedure(s): ROBOTIC ASSISTED TOTAL HYSTERECTOMY WITH BILATERAL SALPINGOECTOMY and RIGHT OOOPHORECTOMY (Right) INTRAUTERINE DEVICE (IUD) REMOVAL (N/A)  Patient Location: PACU  Anesthesia Type:General  Level of Consciousness: sedated  Airway & Oxygen Therapy: Patient Spontanous Breathing and Patient connected to nasal cannula oxygen  Post-op Assessment: Report given to RN and Post -op Vital signs reviewed and stable  Post vital signs: stable  Last Vitals:  Filed Vitals:   04/15/15 0813  BP: 153/80  Pulse: 94  Temp: 36.8 C  Resp: 20    Complications: No apparent anesthesia complications

## 2015-04-15 NOTE — H&P (Signed)
Date of Initial H&P: 04/07/2015  History reviewed, patient examined, no change in status, stable for surgery.

## 2015-04-15 NOTE — Anesthesia Procedure Notes (Signed)
Procedure Name: Intubation Date/Time: 04/15/2015 9:12 AM Performed by: Ignacia Bayley Pre-anesthesia Checklist: Patient identified, Emergency Drugs available, Suction available and Patient being monitored Patient Re-evaluated:Patient Re-evaluated prior to inductionOxygen Delivery Method: Circle system utilized Preoxygenation: Pre-oxygenation with 100% oxygen Intubation Type: IV induction Ventilation: Mask ventilation without difficulty Laryngoscope Size: Miller and 2 Grade View: Grade I Tube type: Oral Tube size: 7.0 mm Number of attempts: 1 Airway Equipment and Method: Stylet Placement Confirmation: ETT inserted through vocal cords under direct vision,  positive ETCO2 and breath sounds checked- equal and bilateral Secured at: 21 cm Tube secured with: Tape Dental Injury: Teeth and Oropharynx as per pre-operative assessment

## 2015-04-15 NOTE — Op Note (Signed)
04/15/2015  4:04 PM  PATIENT:  Virginia Olson  39 y.o. female  PRE-OPERATIVE DIAGNOSIS:  R10.31  RLQ Pain D25.9  Fibroids N80.9 Endometriosis  POST-OPERATIVE DIAGNOSIS:  R10.31 RLQ Pain, D25.9 Fibroids  PROCEDURE:  Procedure(s): ROBOTIC ASSISTED TOTAL HYSTERECTOMY WITH BILATERAL SALPINGOECTOMY and RIGHT OOOPHORECTOMY (Right) INTRAUTERINE DEVICE (IUD) REMOVAL (N/A)  SURGEON:  Surgeon(s) and Role:    * Christophe Louis, MD - Primary    * Thurnell Lose, MD - Assisting  PHYSICIAN ASSISTANT: None  ASSISTANTS: Dr. Thurnell Lose assisted due to concern for adhesive disease and the complexity of the surgery    ANESTHESIA:   general  EBL:  Total I/O In: 1700 [I.V.:1700] Out: 450 [Urine:375; Blood:75]  BLOOD ADMINISTERED:none  DRAINS: Urinary Catheter (Foley)   LOCAL MEDICATIONS USED:  OTHER Ropivacaine  SPECIMEN:  Source of Specimen:  uterus cervix, bilateraly fallopian tubes and right ovary   DISPOSITION OF SPECIMEN:  PATHOLOGY  COUNTS:  YES  TOURNIQUET:  * No tourniquets in log *  DICTATION: .Dragon Dictation  PLAN OF CARE: Admit for overnight observation  PATIENT DISPOSITION:  PACU - hemodynamically stable.   Delay start of Pharmacological VTE agent (>24 hrs) due to surgical blood loss or risk of bleeding: not applicable  Findings Right ovarian cyst suspicious for endometrioma . The left ovary appeared normal. Enlarged fibroid uterus.. Fallopian tubes appeared normal .. Small endometrial implants along right fallopian tube and ovary... The uterus weighed  286 grams  Procedure: The patient was taken to the operating room where she was placed under general anesthesia.Time out was performed. Marland Kitchen She was placed in dorsal lithotomy position and prepped and draped in the usual sterile fashion. A weighted speculum was placed into the vagina. A Deaver was placed anteriorly for retraction. The anterior lip of the cervix was grasped with a single-tooth tenaculum. The vaginal mucosa was  injected with 2.5 cc of ropivacaine at the 2/4/ 8 and 10 o'clock positions.  The Mirena IUD was removed by grasping the strings with ring forceps. The uterus was sounded to 11 cm the cervix was dilated to 6 mm . 0 vicryl suture placed at the 12 and 6:00 positions Of the cervix to facilitate placement of a Ru mi uterine manipulator. The manipulator was placed without difficulty. Weighted speculum and Deaver were removed .  Foley catheter was placed.   Attention was turned to the patient's abdomen where a 12 mm trocar was placed  At  the umbilicus. under direct visualization . The pneumoperitoneum was achieved with PCO2 gas. The laparoscope was removed. 60 cc of ropivacaine were injected into the abdominal cavity. The laparoscope was reinserted. An 8 mm trocar was placed in the right upper quadrant 18 centimeters from the umbilicus.later connected to robotic arm #3). An 8MM incision was made in the Right upper quadrant TROCAR WAS PLACED 8 cm from the umbilicus. Later connected to robotic arm #1. An 8 mm incision was made in the left upper quadrant 17 cm from the umbilicus and connected to robot arm #2. Marland Kitchen Attention was turned to the left upper quadrant where a  8 mm midclavicular assistant trocar was placed. ( All incision sites were injected with 10cc of ropivacaine prior to port placement. )   Once all ports had been placed under direct visualization.The laparoscope was removed and the [employer] robotic system was then right-sided docked. The robotic arms were connected to the corresponding trocars as listed above. The laparoscope was then reinserted. The PK bipolar cautery was placed into port #  2. The monopolar scissor placed in the port #1. A prograsp was placed in port #3. All instruments were directed into the pelvis under direct visualization.  Attention was turned to the surgeons console.. The left mesosalpinx and left utero-ovarian ligament was cauterized with PK and excised with scissors. The broad  ligament was cauterized with PK incised with scissors. The round ligament was cauterized with the PK incised with scissors. The anterior leaf of broad ligament was incised along the bladder reflection to the midline.   The right infundibulopelvic  ligament was cauterized with PK and excised with scissors. The right broad ligament was cauterized with PK excised scissors. The right round ligament was cauterized with PK and excised with scissors. The broad ligament was incised to the midline. The bladder was dissected off the lower uterine segments of the cervix via sharp and blunt dissection. The bladder was filled in a retrograde fashion with methylene blue and sterile water to help with dissection Of the bladder from the lower uterine segment.    The uterine arteries were skeleton bilaterally. They were cauterized with PK and transected. The KOH ring was identified. The anterior colpotomy was performed followed by the posterior colpotomy.  Once the uterus,cervix and bilateral fallopian tubes and right ovary were completely excised the uterus was  removed through the vagina.   The pk and scissors were removed and kocher were placed in the port #1 and the cutting needle driver was placed in to port #2.  The vaginal cuff  Was closed with 0 v lock in a running fashion.  The pelvis was irrigated.  Marland Kitchen.Excellent hemostasis was noted. All pelvic pedicles were examined and hemostasis was noted.  Both ureters were identified and noted to peristals.  Inter seed was placed along the vaginal cuff. All instruments removed from the ports. All ports were removed under direct Visualization. The pneumoperitoneum was released. The fascia of the 12 mm umbilical port was closed with 0 Vicryl .The skin incisions were closed with 4-0 Vicryl and then covered with Derma bond.   Sponge lap and needle counts were correct x. The patient was awakened from anesthesia and taken to the recovery room in stable condition.

## 2015-04-16 ENCOUNTER — Encounter (HOSPITAL_COMMUNITY): Payer: Self-pay | Admitting: Obstetrics and Gynecology

## 2015-04-16 DIAGNOSIS — N920 Excessive and frequent menstruation with regular cycle: Secondary | ICD-10-CM | POA: Diagnosis present

## 2015-04-16 DIAGNOSIS — D251 Intramural leiomyoma of uterus: Secondary | ICD-10-CM | POA: Diagnosis not present

## 2015-04-16 LAB — CBC
HCT: 32.4 % — ABNORMAL LOW (ref 36.0–46.0)
HEMOGLOBIN: 10.8 g/dL — AB (ref 12.0–15.0)
MCH: 28.8 pg (ref 26.0–34.0)
MCHC: 33.3 g/dL (ref 30.0–36.0)
MCV: 86.4 fL (ref 78.0–100.0)
PLATELETS: 266 10*3/uL (ref 150–400)
RBC: 3.75 MIL/uL — ABNORMAL LOW (ref 3.87–5.11)
RDW: 15.1 % (ref 11.5–15.5)
WBC: 11.6 10*3/uL — AB (ref 4.0–10.5)

## 2015-04-16 MED ORDER — OXYCODONE-ACETAMINOPHEN 5-325 MG PO TABS: 1.0000 | ORAL_TABLET | ORAL | Status: DC | PRN

## 2015-04-16 MED ORDER — IBUPROFEN 800 MG PO TABS: 800.0000 mg | ORAL_TABLET | Freq: Three times a day (TID) | ORAL | Status: DC | PRN

## 2015-04-16 NOTE — Anesthesia Postprocedure Evaluation (Signed)
  Anesthesia Post Note  Patient: Virginia Olson  Procedure(s) Performed: Procedure(s): ROBOTIC ASSISTED TOTAL HYSTERECTOMY WITH BILATERAL SALPINGOECTOMY and RIGHT OOOPHORECTOMY (Right) INTRAUTERINE DEVICE (IUD) REMOVAL (N/A)  Anesthesia type: General  Patient location: [facility]  Post pain: Pain level controlled  Post assessment: Post-op Vital signs reviewed  Last Vitals: BP 104/55 mmHg  Pulse 82  Temp(Src) 36.8 C (Oral)  Resp 18  Ht 5\' 9"  (1.753 m)  Wt 282 lb (127.914 kg)  BMI 41.63 kg/m2  SpO2 99%  Post vital signs: Reviewed  Level of consciousness: awake  Complications: No apparent anesthesia complications

## 2015-04-16 NOTE — Discharge Summary (Signed)
Physician Discharge Summary  Patient ID: Virginia Olson MRN: [id number] DOB/AGE: November 14, 1975 39 y.o.  Admit date: 04/15/2015 Discharge date: 04/16/2015  Admission Diagnoses: 1 Menorrhagia 2. Fibroids 3. Pelvic pain   Discharge Diagnoses: Same  Active Problems:   S/P laparoscopic hysterectomy   Menorrhagia   Discharged Condition: good  Hospital Course:  Pt was admitted for observation after robotic assisted laparoscopic hysterectomy with bilateral salpingectomy and right oophorectomy . She did well postoperatively with return of bowel and bladder  function.   Consults: None  Significant Diagnostic Studies: labs: hgb 10.8 on postop day #1  Treatments: surgery: robotic assisted laparoscopic hysterectomy with bilateral salpingectomy and right oophorectomy  Discharge Exam: Blood pressure 104/55, pulse 82, temperature 98.2 F (36.8 C), temperature source Oral, resp. rate 18, height 5\' 9"  (1.753 m), weight 127.914 kg (282 lb), SpO2 99 %. General appearance: alert and cooperative GI: soft appropriately tender nondistended + BS  Extremities: extremities normal, atraumatic, no cyanosis or edema  Disposition: 01-Home or Self Care  Discharge Instructions    Call MD for:  persistant nausea and vomiting    Complete by:  As directed      Call MD for:  redness, tenderness, or signs of infection (pain, swelling, redness, odor or green/yellow discharge around incision site)    Complete by:  As directed      Call MD for:  severe uncontrolled pain    Complete by:  As directed      Call MD for:  temperature >100.4    Complete by:  As directed      Diet - low sodium heart healthy    Complete by:  As directed      Driving Restrictions    Complete by:  As directed   Avoid driving for 1 week     Increase activity slowly    Complete by:  As directed      Lifting restrictions    Complete by:  As directed   Avoid lifting over 10 lbs     Remove dressing in 48 hours    Complete by:  As directed       Sexual Activity Restrictions    Complete by:  As directed   Avoid sexual activity until approved by Dr. Landry Mellow            Medication List    TAKE these medications        acetaminophen 325 MG tablet  Commonly known as:  TYLENOL  Take 650 mg by mouth every 6 (six) hours as needed for mild pain, moderate pain or headache.     albuterol 108 (90 BASE) MCG/ACT inhaler  Commonly known as:  PROVENTIL HFA;VENTOLIN HFA  Inhale 1 puff into the lungs every 6 (six) hours as needed for wheezing or shortness of breath.     amoxicillin-clavulanate 875-125 MG per tablet  Commonly known as:  AUGMENTIN  Take 1 tablet by mouth 2 (two) times daily.     budesonide-formoterol 160-4.5 MCG/ACT inhaler  Commonly known as:  SYMBICORT  Inhale 2 puffs into the lungs daily as needed (bronchitus).     ibuprofen 200 MG tablet  Commonly known as:  ADVIL,MOTRIN  Take 400 mg by mouth every 6 (six) hours as needed for headache, mild pain or moderate pain.     ibuprofen 800 MG tablet  Commonly known as:  ADVIL,MOTRIN  Take 1 tablet (800 mg total) by mouth every 8 (eight) hours as needed for mild pain or moderate pain (mild pain).  ondansetron 4 MG disintegrating tablet  Commonly known as:  ZOFRAN ODT  4mg  ODT q4 hours prn nausea/vomit     oxyCODONE-acetaminophen 5-325 MG per tablet  Commonly known as:  PERCOCET/ROXICET  Take 1-2 tablets by mouth every 4 (four) hours as needed (moderate to severe pain (when tolerating fluids)).     polyethylene glycol packet  Commonly known as:  MIRALAX / GLYCOLAX  Take 17 g by mouth daily as needed for mild constipation.     saccharomyces boulardii 250 MG capsule  Commonly known as:  FLORASTOR  Take 1 capsule (250 mg total) by mouth 2 (two) times daily.     sorbitol 70 % Soln  Take 30 mLs by mouth daily as needed for moderate constipation.           Follow-up Information    Follow up with Catha Brow., MD. Schedule an appointment as soon as possible  for a visit in 2 weeks.   Specialty:  Obstetrics and Gynecology   Why:  postoperative visit    Contact information:   [address] [place] [address] [phone]       Signed: Catha Brow. 04/16/2015, 8:29 AM

## 2015-04-16 NOTE — Progress Notes (Signed)
Discharge teaching complete. Pt understood all instructions and did not have any questions. Pt ambulated out of the hospital discharged home to family.

## 2015-04-16 NOTE — Addendum Note (Signed)
Addendum  created 04/16/15 0829 by Flossie Dibble, CRNA   Modules edited: Notes Section   Notes Section:  File: [id number]

## 2015-09-09 ENCOUNTER — Other Ambulatory Visit: Payer: Self-pay | Admitting: Family

## 2015-09-09 ENCOUNTER — Ambulatory Visit
Admission: RE | Admit: 2015-09-09 | Discharge: 2015-09-09 | Disposition: A | Discharge status: Home / Self Care | Payer: 59 | Source: Ambulatory Visit | Attending: Family | Admitting: Family

## 2015-09-09 DIAGNOSIS — M25562 Pain in left knee: Secondary | ICD-10-CM

## 2016-04-01 IMAGING — CR DG CHEST 1V PORT
1 series · 2 of 2 positions shown · non-contrast
Comparison: None.

CLINICAL DATA: Abdominal pain and vomiting.  Syncope.

EXAM:
PORTABLE CHEST - 1 VIEW

[Series 1: AP · U · 2 of 2 slices shown]
[im 1/2]
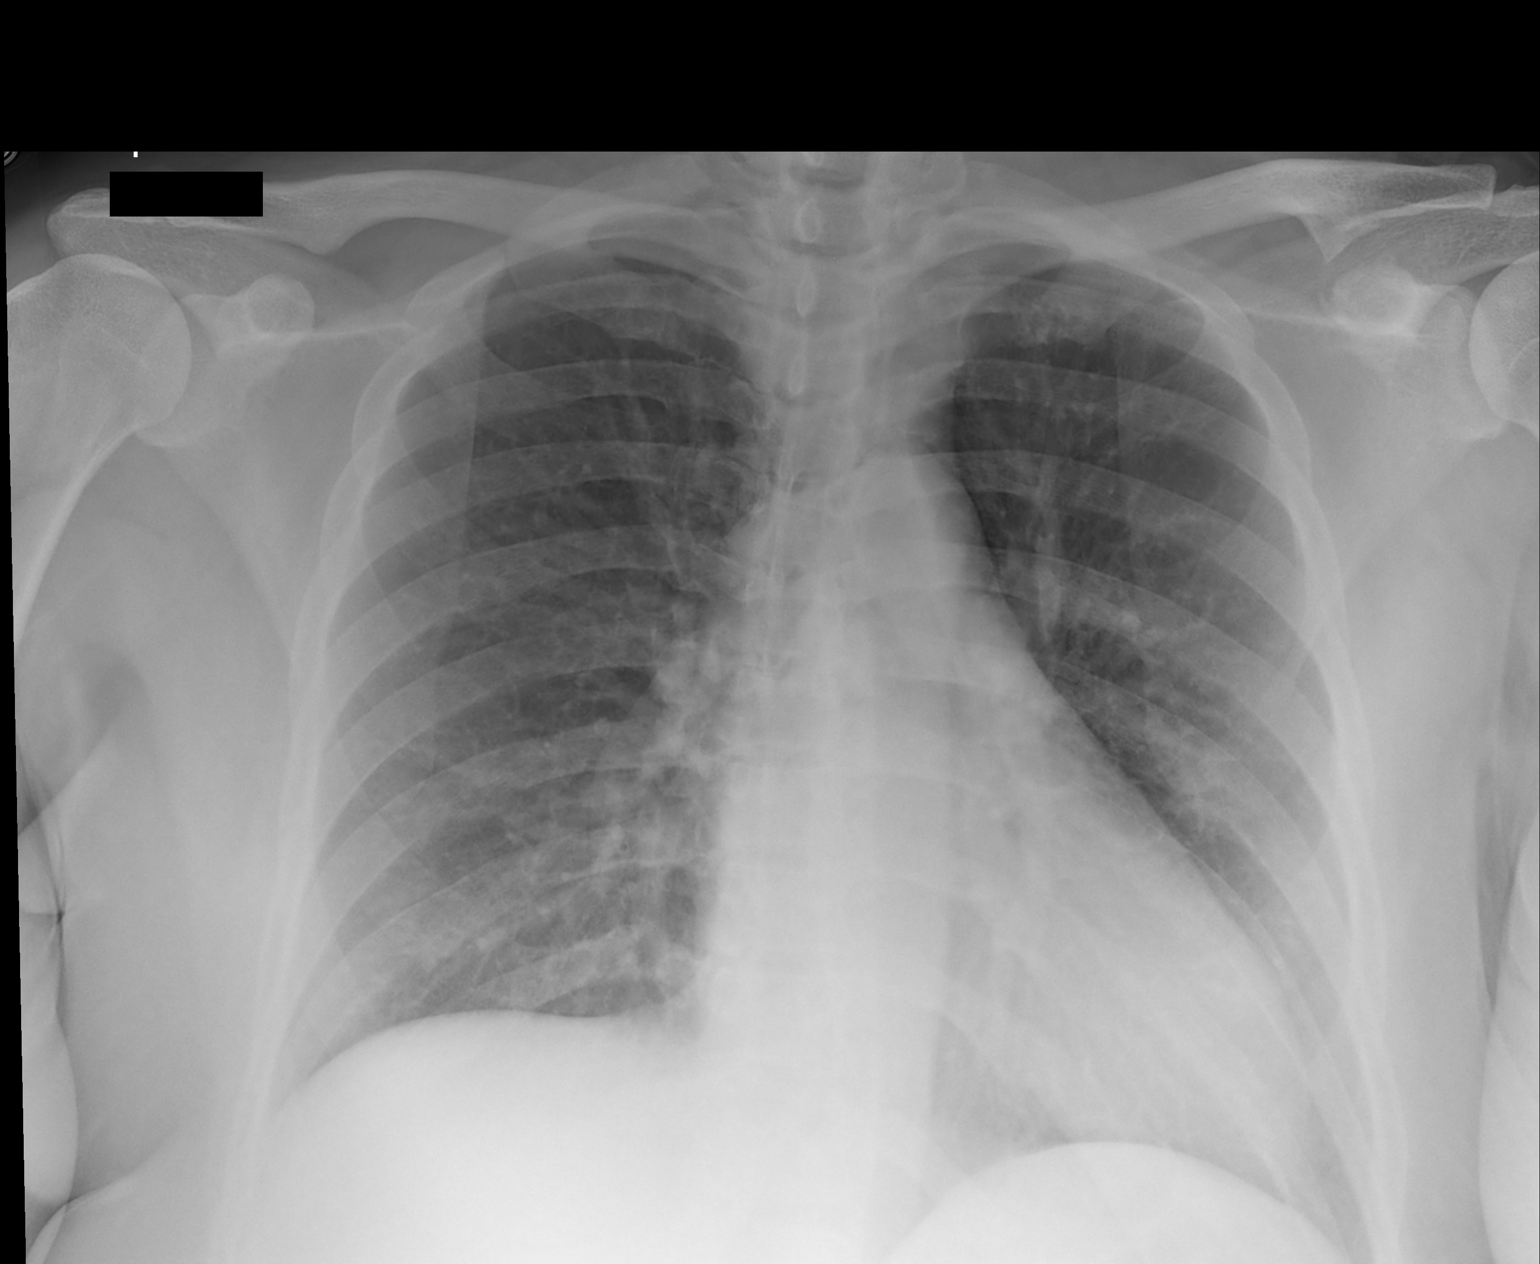
[im 2/2]
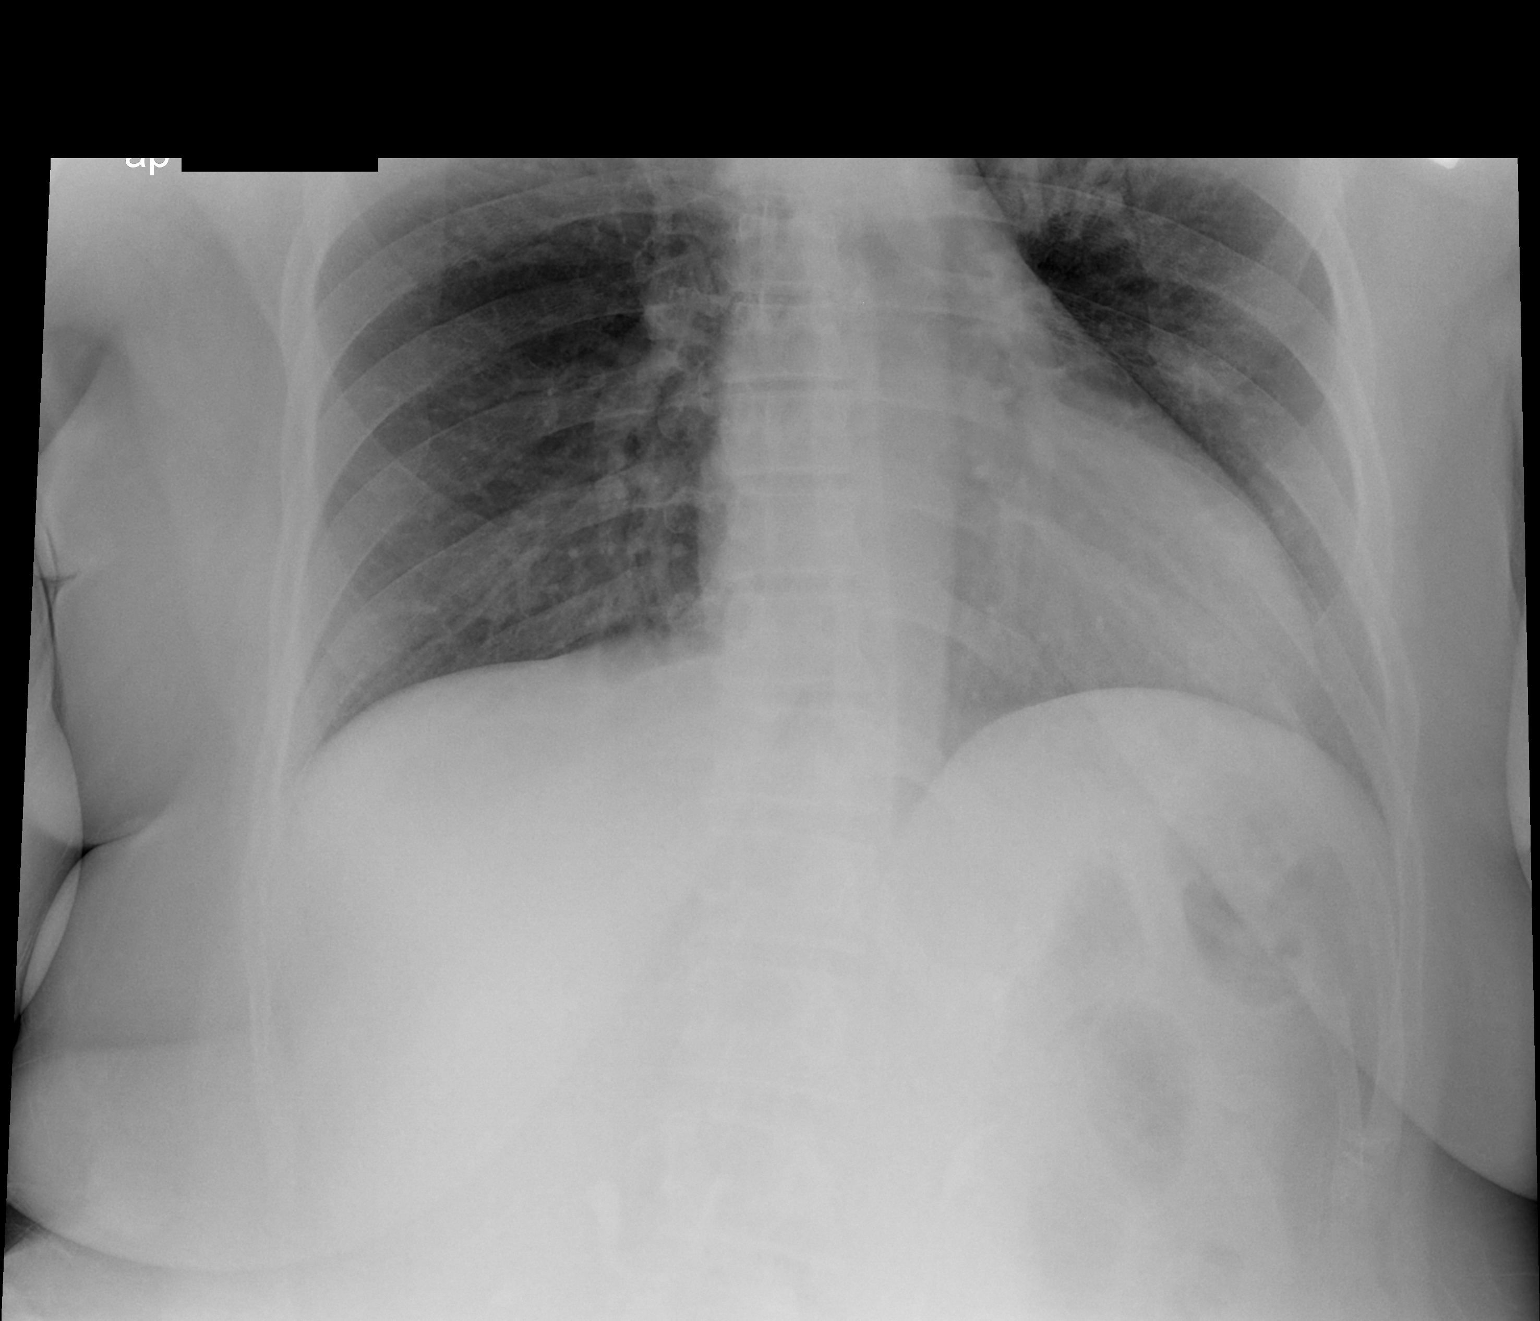

[2 of 2 positions shown; findings below may reference images not displayed]

FINDINGS: The heart size and mediastinal contours are within normal limits.
Both lungs are clear. The visualized skeletal structures are
unremarkable.
IMPRESSION: No active disease.

## 2016-07-15 ENCOUNTER — Ambulatory Visit
Admission: RE | Admit: 2016-07-15 | Discharge: 2016-07-15 | Disposition: A | Discharge status: Home / Self Care | Payer: Commercial Managed Care - HMO | Source: Ambulatory Visit | Attending: Family Medicine | Admitting: Family Medicine

## 2016-07-15 ENCOUNTER — Other Ambulatory Visit: Payer: Self-pay | Admitting: Family Medicine

## 2016-07-15 DIAGNOSIS — R1030 Lower abdominal pain, unspecified: Secondary | ICD-10-CM

## 2016-07-15 MED ORDER — IOPAMIDOL (ISOVUE-300) INJECTION 61%
125.0000 mL | Freq: Once | INTRAVENOUS | Status: AC | PRN
  Administered 2016-07-15: 125 mL via INTRAVENOUS

## 2017-06-06 DIAGNOSIS — M25572 Pain in left ankle and joints of left foot: Secondary | ICD-10-CM | POA: Diagnosis not present

## 2017-06-06 DIAGNOSIS — R262 Difficulty in walking, not elsewhere classified: Secondary | ICD-10-CM | POA: Diagnosis not present

## 2017-06-06 DIAGNOSIS — M25475 Effusion, left foot: Secondary | ICD-10-CM | POA: Diagnosis not present

## 2017-06-06 DIAGNOSIS — M2570 Osteophyte, unspecified joint: Secondary | ICD-10-CM | POA: Diagnosis not present

## 2017-06-14 DIAGNOSIS — R262 Difficulty in walking, not elsewhere classified: Secondary | ICD-10-CM | POA: Diagnosis not present

## 2017-06-14 DIAGNOSIS — M2570 Osteophyte, unspecified joint: Secondary | ICD-10-CM | POA: Diagnosis not present

## 2017-06-14 DIAGNOSIS — M25572 Pain in left ankle and joints of left foot: Secondary | ICD-10-CM | POA: Diagnosis not present

## 2017-06-14 DIAGNOSIS — M25475 Effusion, left foot: Secondary | ICD-10-CM | POA: Diagnosis not present

## 2017-06-14 DIAGNOSIS — M5412 Radiculopathy, cervical region: Secondary | ICD-10-CM | POA: Diagnosis not present

## 2017-06-19 DIAGNOSIS — M2570 Osteophyte, unspecified joint: Secondary | ICD-10-CM | POA: Diagnosis not present

## 2017-06-19 DIAGNOSIS — M25475 Effusion, left foot: Secondary | ICD-10-CM | POA: Diagnosis not present

## 2017-06-19 DIAGNOSIS — R262 Difficulty in walking, not elsewhere classified: Secondary | ICD-10-CM | POA: Diagnosis not present

## 2017-06-19 DIAGNOSIS — M25572 Pain in left ankle and joints of left foot: Secondary | ICD-10-CM | POA: Diagnosis not present

## 2017-06-22 DIAGNOSIS — R200 Anesthesia of skin: Secondary | ICD-10-CM | POA: Diagnosis not present

## 2017-06-22 DIAGNOSIS — M79641 Pain in right hand: Secondary | ICD-10-CM | POA: Diagnosis not present

## 2017-06-28 DIAGNOSIS — M25475 Effusion, left foot: Secondary | ICD-10-CM | POA: Diagnosis not present

## 2017-06-28 DIAGNOSIS — M25572 Pain in left ankle and joints of left foot: Secondary | ICD-10-CM | POA: Diagnosis not present

## 2017-06-28 DIAGNOSIS — M2570 Osteophyte, unspecified joint: Secondary | ICD-10-CM | POA: Diagnosis not present

## 2017-06-28 DIAGNOSIS — R262 Difficulty in walking, not elsewhere classified: Secondary | ICD-10-CM | POA: Diagnosis not present

## 2017-07-05 DIAGNOSIS — Z01812 Encounter for preprocedural laboratory examination: Secondary | ICD-10-CM | POA: Diagnosis not present

## 2017-07-05 DIAGNOSIS — L600 Ingrowing nail: Secondary | ICD-10-CM | POA: Diagnosis not present

## 2017-07-05 DIAGNOSIS — Z79899 Other long term (current) drug therapy: Secondary | ICD-10-CM | POA: Diagnosis not present

## 2017-07-05 DIAGNOSIS — M25774 Osteophyte, right foot: Secondary | ICD-10-CM | POA: Diagnosis not present

## 2017-07-10 DIAGNOSIS — N907 Vulvar cyst: Secondary | ICD-10-CM | POA: Diagnosis not present

## 2017-07-13 DIAGNOSIS — L600 Ingrowing nail: Secondary | ICD-10-CM | POA: Diagnosis not present

## 2017-07-13 DIAGNOSIS — M25774 Osteophyte, right foot: Secondary | ICD-10-CM | POA: Diagnosis not present

## 2017-07-17 DIAGNOSIS — M25571 Pain in right ankle and joints of right foot: Secondary | ICD-10-CM | POA: Diagnosis not present

## 2017-07-31 DIAGNOSIS — M79641 Pain in right hand: Secondary | ICD-10-CM | POA: Diagnosis not present

## 2017-07-31 DIAGNOSIS — R200 Anesthesia of skin: Secondary | ICD-10-CM | POA: Diagnosis not present

## 2017-08-30 ENCOUNTER — Other Ambulatory Visit: Payer: Self-pay | Admitting: Family Medicine

## 2017-08-30 DIAGNOSIS — Z1231 Encounter for screening mammogram for malignant neoplasm of breast: Secondary | ICD-10-CM

## 2017-09-01 ENCOUNTER — Ambulatory Visit: Payer: Commercial Managed Care - HMO

## 2017-11-10 ENCOUNTER — Ambulatory Visit
Admission: RE | Admit: 2017-11-10 | Discharge: 2017-11-10 | Disposition: A | Discharge status: Home / Self Care | Payer: BLUE CROSS/BLUE SHIELD | Source: Ambulatory Visit | Attending: Family Medicine | Admitting: Family Medicine

## 2017-11-10 DIAGNOSIS — Z1231 Encounter for screening mammogram for malignant neoplasm of breast: Secondary | ICD-10-CM

## 2017-11-13 ENCOUNTER — Other Ambulatory Visit: Payer: Self-pay | Admitting: Family Medicine

## 2017-11-13 DIAGNOSIS — K640 First degree hemorrhoids: Secondary | ICD-10-CM | POA: Diagnosis not present

## 2017-11-13 DIAGNOSIS — R928 Other abnormal and inconclusive findings on diagnostic imaging of breast: Secondary | ICD-10-CM

## 2017-11-15 DIAGNOSIS — L600 Ingrowing nail: Secondary | ICD-10-CM | POA: Diagnosis not present

## 2017-11-24 ENCOUNTER — Other Ambulatory Visit: Payer: Self-pay | Admitting: Family Medicine

## 2017-11-24 ENCOUNTER — Ambulatory Visit
Admission: RE | Admit: 2017-11-24 | Discharge: 2017-11-24 | Disposition: A | Discharge status: Home / Self Care | Payer: BLUE CROSS/BLUE SHIELD | Source: Ambulatory Visit | Attending: Family Medicine | Admitting: Family Medicine

## 2017-11-24 DIAGNOSIS — R928 Other abnormal and inconclusive findings on diagnostic imaging of breast: Secondary | ICD-10-CM | POA: Diagnosis not present

## 2017-11-24 DIAGNOSIS — N6320 Unspecified lump in the left breast, unspecified quadrant: Secondary | ICD-10-CM

## 2017-11-24 DIAGNOSIS — L600 Ingrowing nail: Secondary | ICD-10-CM | POA: Diagnosis not present

## 2017-11-28 ENCOUNTER — Other Ambulatory Visit: Payer: Self-pay | Admitting: Family Medicine

## 2017-11-28 DIAGNOSIS — N6320 Unspecified lump in the left breast, unspecified quadrant: Secondary | ICD-10-CM

## 2017-11-29 ENCOUNTER — Ambulatory Visit
Admission: RE | Admit: 2017-11-29 | Discharge: 2017-11-29 | Disposition: A | Discharge status: Home / Self Care | Payer: BLUE CROSS/BLUE SHIELD | Source: Ambulatory Visit | Attending: Family Medicine | Admitting: Family Medicine

## 2017-11-29 ENCOUNTER — Ambulatory Visit: Payer: BLUE CROSS/BLUE SHIELD

## 2017-11-29 ENCOUNTER — Other Ambulatory Visit: Payer: Self-pay | Admitting: Family Medicine

## 2017-11-29 DIAGNOSIS — N6320 Unspecified lump in the left breast, unspecified quadrant: Secondary | ICD-10-CM

## 2017-11-29 DIAGNOSIS — D242 Benign neoplasm of left breast: Secondary | ICD-10-CM | POA: Diagnosis not present

## 2017-11-29 DIAGNOSIS — N6322 Unspecified lump in the left breast, upper inner quadrant: Secondary | ICD-10-CM | POA: Diagnosis not present

## 2017-11-29 HISTORY — PX: BREAST EXCISIONAL BIOPSY: SUR124

## 2017-11-30 DIAGNOSIS — J069 Acute upper respiratory infection, unspecified: Secondary | ICD-10-CM | POA: Diagnosis not present

## 2017-11-30 DIAGNOSIS — J029 Acute pharyngitis, unspecified: Secondary | ICD-10-CM | POA: Diagnosis not present

## 2017-12-05 ENCOUNTER — Other Ambulatory Visit: Payer: Self-pay | Admitting: Surgery

## 2017-12-05 DIAGNOSIS — N6320 Unspecified lump in the left breast, unspecified quadrant: Secondary | ICD-10-CM

## 2017-12-12 NOTE — Pre-Procedure Instructions (Signed)
    Virginia Olson  12/12/2017      [place], [place] [address] [place] [place] [address] Phone: [phone] Fax: [phone]    Your procedure is scheduled on Monday, December 18, 2017  Report to [facility] Admitting at 10:00 A.M.  Call this number if you have problems the morning of surgery:  [phone]   Remember: Drink the bottle of Ensure Pre-Surgery drink before leaving home on day of surgery.  Do not eat food or drink liquids after midnight Sunday, December 17, 2017  Take these medicines the morning of surgery with A SIP OF WATER : if needed: Tylenol for pain,  albuterol (PROVENTIL HFA;VENTOLIN HFA)  Inhaler for wheezing or shortness of breath (Bring inhaler in with you on day of surgery). Stop taking Aspirin, vitamins, fish oil, Elderberry and herbal medications. Do not take any NSAIDs ie: Ibuprofen, Advil, Naproxen (Aleve), Motrin, BC and Goody Powder; stop now.   Do not wear jewelry, make-up or nail polish.  Do not wear lotions, powders, or perfumes, or deodorant.  Do not shave 48 hours prior to surgery.    Do not bring valuables to the hospital.  [facility] is not responsible for any belongings or valuables.  Contacts, dentures or bridgework may not be worn into surgery.  Leave your suitcase in the car.  After surgery it may be brought to your room. For patients admitted to the hospital, discharge time will be determined by your treatment team. Patients discharged the day of surgery will not be allowed to drive home.  Special instructions:Shower the night before surgery and morning of surgery with CHG. Please read over the following fact sheets that you were given. Pain Booklet, Coughing and Deep Breathing and Surgical Site Infection Prevention

## 2017-12-13 ENCOUNTER — Inpatient Hospital Stay (HOSPITAL_COMMUNITY)
Admission: RE | Admit: 2017-12-13 | Discharge: 2017-12-13 | Disposition: A | Discharge status: Home / Self Care | Payer: BLUE CROSS/BLUE SHIELD | Source: Ambulatory Visit

## 2017-12-15 ENCOUNTER — Inpatient Hospital Stay: Admission: RE | Admit: 2017-12-15 | Payer: BLUE CROSS/BLUE SHIELD | Source: Ambulatory Visit

## 2017-12-26 ENCOUNTER — Ambulatory Visit
Admission: RE | Admit: 2017-12-26 | Discharge: 2017-12-26 | Disposition: A | Discharge status: Home / Self Care | Payer: BLUE CROSS/BLUE SHIELD | Source: Ambulatory Visit | Attending: Surgery | Admitting: Surgery

## 2017-12-26 DIAGNOSIS — N6320 Unspecified lump in the left breast, unspecified quadrant: Secondary | ICD-10-CM

## 2017-12-26 MED ORDER — DEXTROSE 5 % IV SOLN
3.0000 g | INTRAVENOUS | Status: AC
  Administered 2017-12-27: 3 g via INTRAVENOUS
  Filled 2017-12-26: qty 3

## 2017-12-26 NOTE — Progress Notes (Addendum)
Denies chest pain. Patient reports that she currently has bronchitis but is getting better.

## 2017-12-26 NOTE — H&P (Signed)
Virginia Olson Documented: 12/05/2017 2:46 PM Location: [facility] Patient #: [id number] DOB: [birth year] Married / Language: English / Race: Black or African American Female   History of Present Illness (Virginia Olson A. Ninfa Linden MD; 12/05/2017 3:11 PM) The patient is a 42 year old female who presents with a breast mass. This is a pleasant patient for by Dr. Evangeline Dakin for the evaluation of a left breast mass. She was found to have a mass in her left breast on screening mammography. It was a 9 mm lesion in the upper-outer quadrant. 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 This, however, was felt to be discordant by the radiologist because of the radiographic findings. Excision of the mass and recommended. She reports no previous surgery on her breast. There is no family history of breast cancer. She is otherwise healthy and without complaints.   Past Surgical History Malachi Bonds, CMA; 12/05/2017 2:58 PM) Foot Surgery  Left. Oral Surgery   Allergies Andee Poles Gerrigner, CMA; 12/05/2017 2:47 PM) No Known Allergies [12/05/2017]: Allergies Reconciled   Medication History Levonne Spiller, CMA; 12/05/2017 2:47 PM) Albuterol (90MCG/ACT Aerosol Soln, Inhalation) Active. Medications Reconciled  Other Problems Malachi Bonds, CMA; 12/05/2017 2:58 PM) No pertinent past medical history     Review of Systems Malachi Bonds CMA; 12/05/2017 2:58 PM) General Not Present- Appetite Loss, Chills, Fatigue, Fever, Night Sweats, Weight Gain and Weight Loss. Skin Not Present- Change in Wart/Mole, Dryness, Hives, Jaundice, New Lesions, Non-Healing Wounds, Rash and Ulcer. HEENT Not Present- Earache, Hearing Loss, Hoarseness, Nose Bleed, Oral Ulcers, Ringing in the Ears, Seasonal Allergies, Sinus Pain, Sore Throat, Visual Disturbances, Wears glasses/contact lenses and Yellow Eyes. Respiratory Not Present- Bloody sputum, Chronic Cough, Difficulty Breathing, Snoring and  Wheezing. Breast Present- Breast Mass. Not Present- Breast Pain, Nipple Discharge and Skin Changes. Cardiovascular Not Present- Chest Pain, Difficulty Breathing Lying Down, Leg Cramps, Palpitations, Rapid Heart Rate, Shortness of Breath and Swelling of Extremities. Gastrointestinal Not Present- Abdominal Pain, Bloating, Bloody Stool, Change in Bowel Habits, Chronic diarrhea, Constipation, Difficulty Swallowing, Excessive gas, Gets full quickly at meals, Hemorrhoids, Indigestion, Nausea, Rectal Pain and Vomiting. Musculoskeletal Not Present- Back Pain, Joint Pain, Joint Stiffness, Muscle Pain, Muscle Weakness and Swelling of Extremities. Neurological Not Present- Decreased Memory, Fainting, Headaches, Numbness, Seizures, Tingling, Tremor, Trouble walking and Weakness. Psychiatric Not Present- Anxiety, Bipolar, Change in Sleep Pattern, Depression, Fearful and Frequent crying. Endocrine Not Present- Cold Intolerance, Excessive Hunger, Hair Changes, Heat Intolerance, Hot flashes and New Diabetes. Hematology Not Present- Blood Thinners, Easy Bruising, Excessive bleeding, Gland problems, HIV and Persistent Infections.  Vitals Andee Poles Gerrigner CMA; 12/05/2017 2:47 PM) 12/05/2017 2:47 PM Weight: 305.13 lb Height: 69.5in Body Surface Area: 2.48 m Body Mass Index: 44.41 kg/m  Temp.: 98.41F(Oral)  Pulse: 108 (Regular)  BP: 130/80 (Sitting, Right Arm, Large)       Physical Exam (Kayveon Lennartz A. Ninfa Linden MD; 12/05/2017 3:12 PM) General Mental Status-Alert. General Appearance-Consistent with stated age. Hydration-Well hydrated. Voice-Normal.  Head and Neck Head-normocephalic, atraumatic with no lesions or palpable masses. Trachea-midline. Thyroid Gland Characteristics - normal size and consistency.  Eye Eyeball - Bilateral-Extraocular movements intact. Sclera/Conjunctiva - Bilateral-No scleral icterus.  Chest and Lung Exam Chest and lung exam reveals -quiet, even  and easy respiratory effort with no use of accessory muscles and on auscultation, normal breath sounds, no adventitious sounds and normal vocal resonance. Inspection Chest Wall - Normal. Back - normal.  Breast Breast - Left-Symmetric, Non Tender, No Biopsy scars, no Dimpling, No Inflammation, No Lumpectomy scars,  No Mastectomy scars, No Peau d' Orange. Breast - Right-Symmetric, Non Tender, No Biopsy scars, no Dimpling, No Inflammation, No Lumpectomy scars, No Mastectomy scars, No Peau d' Orange. Breast Lump-No Palpable Breast Mass. Note: There is just a small amount of ecchymosis in the left breast from the biopsy. I can palpate a small hematoma in the breast but I do not believe this is the mass  I have reviewed her mammograms, ultrasound, and pathology results. The ultrasound shows irregularities and hypoechoic shadowing of the left breast mass which is worrisome   Cardiovascular Cardiovascular examination reveals -normal heart sounds, regular rate and rhythm with no murmurs and normal pedal pulses bilaterally.  Abdomen Inspection Inspection of the abdomen reveals - No Hernias. Skin - Scar - no surgical scars. Palpation/Percussion Palpation and Percussion of the abdomen reveal - Soft, Non Tender, No Rebound tenderness, No Rigidity (guarding) and No hepatosplenomegaly. Auscultation Auscultation of the abdomen reveals - Bowel sounds normal.  Neurologic - Did not examine.  Musculoskeletal - Did not examine.  Lymphatic Head & Neck  General Head & Neck Lymphatics: Bilateral - Description - Normal. Axillary  General Axillary Region: Bilateral - Description - Normal. Tenderness - Non Tender. Femoral & Inguinal - Did not examine.    Assessment & Plan (Tennis Mckinnon A. Ninfa Linden MD; 12/05/2017 3:13 PM) LEFT BREAST MASS (N63.20) Impression: I had a long discussion with the patient regarding the left breast mass. Although the pathology on the stereotactic biopsy was benign, surgical  excision of the masses been recommended because it was found to be irregular enough on imaging that the final pathology was felt to be discordant with the mammographic and radiologic findings. I discussed with her a left breast radioactive seed guided lumpectomy. I explained the procedure to her in detail. We discussed the risk of surgery which includes but is not limited to bleeding, infection, the need for further surgery malignancy is found, injury to surrounding structures, cardiopulmonary issues, postoperative recovery, etc. She understands the reason for surgery and wishes to proceed with surgery

## 2017-12-27 ENCOUNTER — Other Ambulatory Visit: Payer: Self-pay

## 2017-12-27 ENCOUNTER — Ambulatory Visit (HOSPITAL_COMMUNITY): Payer: BLUE CROSS/BLUE SHIELD | Admitting: Vascular Surgery

## 2017-12-27 ENCOUNTER — Ambulatory Visit
Admission: RE | Admit: 2017-12-27 | Discharge: 2017-12-27 | Disposition: A | Discharge status: Home / Self Care | Payer: BLUE CROSS/BLUE SHIELD | Source: Ambulatory Visit | Attending: Surgery | Admitting: Surgery

## 2017-12-27 ENCOUNTER — Ambulatory Visit (HOSPITAL_COMMUNITY)
Admission: RE | Admit: 2017-12-27 | Discharge: 2017-12-27 | Disposition: A | Discharge status: Home / Self Care | Payer: BLUE CROSS/BLUE SHIELD | Source: Ambulatory Visit | Attending: Surgery | Admitting: Surgery

## 2017-12-27 ENCOUNTER — Encounter (HOSPITAL_COMMUNITY): Payer: Self-pay | Admitting: *Deleted

## 2017-12-27 ENCOUNTER — Encounter (HOSPITAL_COMMUNITY)
Admission: RE | Disposition: A | Discharge status: Home / Self Care | Payer: Self-pay | Source: Ambulatory Visit | Attending: Surgery

## 2017-12-27 DIAGNOSIS — D242 Benign neoplasm of left breast: Secondary | ICD-10-CM | POA: Diagnosis not present

## 2017-12-27 DIAGNOSIS — N6320 Unspecified lump in the left breast, unspecified quadrant: Secondary | ICD-10-CM

## 2017-12-27 DIAGNOSIS — D259 Leiomyoma of uterus, unspecified: Secondary | ICD-10-CM | POA: Diagnosis not present

## 2017-12-27 DIAGNOSIS — Z79899 Other long term (current) drug therapy: Secondary | ICD-10-CM | POA: Insufficient documentation

## 2017-12-27 DIAGNOSIS — D649 Anemia, unspecified: Secondary | ICD-10-CM | POA: Diagnosis not present

## 2017-12-27 DIAGNOSIS — J449 Chronic obstructive pulmonary disease, unspecified: Secondary | ICD-10-CM | POA: Insufficient documentation

## 2017-12-27 DIAGNOSIS — R928 Other abnormal and inconclusive findings on diagnostic imaging of breast: Secondary | ICD-10-CM | POA: Diagnosis not present

## 2017-12-27 DIAGNOSIS — E876 Hypokalemia: Secondary | ICD-10-CM | POA: Diagnosis not present

## 2017-12-27 DIAGNOSIS — Z6841 Body Mass Index (BMI) 40.0 and over, adult: Secondary | ICD-10-CM | POA: Insufficient documentation

## 2017-12-27 DIAGNOSIS — K219 Gastro-esophageal reflux disease without esophagitis: Secondary | ICD-10-CM | POA: Insufficient documentation

## 2017-12-27 HISTORY — PX: BREAST LUMPECTOMY WITH RADIOACTIVE SEED LOCALIZATION: SHX6424

## 2017-12-27 HISTORY — DX: Unspecified asthma, uncomplicated: J45.909

## 2017-12-27 LAB — CBC
HEMATOCRIT: 37.6 % (ref 36.0–46.0)
HEMOGLOBIN: 12.3 g/dL (ref 12.0–15.0)
MCH: 28.5 pg (ref 26.0–34.0)
MCHC: 32.7 g/dL (ref 30.0–36.0)
MCV: 87 fL (ref 78.0–100.0)
Platelets: 281 10*3/uL (ref 150–400)
RBC: 4.32 MIL/uL (ref 3.87–5.11)
RDW: 14.1 % (ref 11.5–15.5)
WBC: 5.8 10*3/uL (ref 4.0–10.5)

## 2017-12-27 LAB — BASIC METABOLIC PANEL
Anion gap: 8 (ref 5–15)
BUN: 5 mg/dL — ABNORMAL LOW (ref 6–20)
CHLORIDE: 106 mmol/L (ref 101–111)
CO2: 20 mmol/L — AB (ref 22–32)
Calcium: 9 mg/dL (ref 8.9–10.3)
Creatinine, Ser: 0.62 mg/dL (ref 0.44–1.00)
GFR calc Af Amer: >60 mL/min (ref >60)
GFR calc non Af Amer: >60 mL/min (ref >60)
Glucose, Bld: 89 mg/dL (ref 65–99)
POTASSIUM: 4 mmol/L (ref 3.5–5.1)
SODIUM: 134 mmol/L — AB (ref 135–145)

## 2017-12-27 SURGERY — BREAST LUMPECTOMY WITH RADIOACTIVE SEED LOCALIZATION
Anesthesia: General | Site: Breast | Laterality: Left

## 2017-12-27 MED ORDER — ACETAMINOPHEN 500 MG PO TABS: 1000.0000 mg | ORAL_TABLET | ORAL | Status: DC

## 2017-12-27 MED ORDER — SCOPOLAMINE 1 MG/3DAYS TD PT72
MEDICATED_PATCH | TRANSDERMAL | Status: DC | PRN
  Administered 2017-12-27: 1 via TRANSDERMAL

## 2017-12-27 MED ORDER — 0.9 % SODIUM CHLORIDE (POUR BTL) OPTIME
TOPICAL | Status: DC | PRN
  Administered 2017-12-27: 1000 mL

## 2017-12-27 MED ORDER — FENTANYL CITRATE (PF) 250 MCG/5ML IJ SOLN
INTRAMUSCULAR | Status: AC
  Filled 2017-12-27: qty 5

## 2017-12-27 MED ORDER — MIDAZOLAM HCL 2 MG/2ML IJ SOLN
INTRAMUSCULAR | Status: DC | PRN
  Administered 2017-12-27: 2 mg via INTRAVENOUS

## 2017-12-27 MED ORDER — PROPOFOL 10 MG/ML IV BOLUS
INTRAVENOUS | Status: DC | PRN
  Administered 2017-12-27: 50 mg via INTRAVENOUS
  Administered 2017-12-27: 200 mg via INTRAVENOUS

## 2017-12-27 MED ORDER — PROPOFOL 10 MG/ML IV BOLUS
INTRAVENOUS | Status: AC
  Filled 2017-12-27: qty 20

## 2017-12-27 MED ORDER — FENTANYL CITRATE (PF) 250 MCG/5ML IJ SOLN
INTRAMUSCULAR | Status: DC | PRN
  Administered 2017-12-27 (×3): 50 ug via INTRAVENOUS

## 2017-12-27 MED ORDER — PHENYLEPHRINE 40 MCG/ML (10ML) SYRINGE FOR IV PUSH (FOR BLOOD PRESSURE SUPPORT)
PREFILLED_SYRINGE | INTRAVENOUS | Status: DC | PRN
  Administered 2017-12-27: 80 ug via INTRAVENOUS

## 2017-12-27 MED ORDER — ONDANSETRON HCL 4 MG/2ML IJ SOLN
INTRAMUSCULAR | Status: DC | PRN
  Administered 2017-12-27: 4 mg via INTRAVENOUS

## 2017-12-27 MED ORDER — LACTATED RINGERS IV SOLN
INTRAVENOUS | Status: DC
  Administered 2017-12-27: 14:00:00 via INTRAVENOUS

## 2017-12-27 MED ORDER — MIDAZOLAM HCL 2 MG/2ML IJ SOLN
INTRAMUSCULAR | Status: AC
  Filled 2017-12-27: qty 2

## 2017-12-27 MED ORDER — OXYCODONE HCL 5 MG PO TABS: 5.0000 mg | ORAL_TABLET | Freq: Four times a day (QID) | ORAL | 0 refills | Status: AC | PRN

## 2017-12-27 MED ORDER — GABAPENTIN 300 MG PO CAPS: 300.0000 mg | ORAL_CAPSULE | ORAL | Status: DC

## 2017-12-27 MED ORDER — BUPIVACAINE-EPINEPHRINE (PF) 0.25% -1:200000 IJ SOLN
INTRAMUSCULAR | Status: AC
  Filled 2017-12-27: qty 30

## 2017-12-27 MED ORDER — CHLORHEXIDINE GLUCONATE CLOTH 2 % EX PADS: 6.0000 | MEDICATED_PAD | Freq: Once | CUTANEOUS | Status: DC

## 2017-12-27 MED ORDER — DEXAMETHASONE SODIUM PHOSPHATE 10 MG/ML IJ SOLN
INTRAMUSCULAR | Status: DC | PRN
  Administered 2017-12-27: 10 mg via INTRAVENOUS

## 2017-12-27 MED ORDER — CELECOXIB 200 MG PO CAPS: 200.0000 mg | ORAL_CAPSULE | ORAL | Status: DC

## 2017-12-27 MED ORDER — LIDOCAINE 2% (20 MG/ML) 5 ML SYRINGE
INTRAMUSCULAR | Status: DC | PRN
  Administered 2017-12-27: 20 mg via INTRAVENOUS

## 2017-12-27 MED ORDER — BUPIVACAINE-EPINEPHRINE 0.25% -1:200000 IJ SOLN
INTRAMUSCULAR | Status: DC | PRN
  Administered 2017-12-27: 18 mL

## 2017-12-27 SURGICAL SUPPLY — 38 items
APPLIER CLIP 9.375 MED OPEN (MISCELLANEOUS) ×2
BINDER BREAST LRG (GAUZE/BANDAGES/DRESSINGS) IMPLANT
BINDER BREAST XLRG (GAUZE/BANDAGES/DRESSINGS) IMPLANT
BLADE SURG 15 STRL LF DISP TIS (BLADE) ×1 IMPLANT
BLADE SURG 15 STRL SS (BLADE) ×1
CANISTER SUCT 3000ML PPV (MISCELLANEOUS) ×2 IMPLANT
CHLORAPREP W/TINT 26ML (MISCELLANEOUS) ×2 IMPLANT
CLIP APPLIE 9.375 MED OPEN (MISCELLANEOUS) ×1 IMPLANT
COVER PROBE W GEL 5X96 (DRAPES) ×2 IMPLANT
COVER SURGICAL LIGHT HANDLE (MISCELLANEOUS) ×2 IMPLANT
DERMABOND ADVANCED (GAUZE/BANDAGES/DRESSINGS) ×1
DERMABOND ADVANCED .7 DNX12 (GAUZE/BANDAGES/DRESSINGS) ×1 IMPLANT
DEVICE DUBIN SPECIMEN MAMMOGRA (MISCELLANEOUS) ×2 IMPLANT
DRAPE CHEST BREAST 15X10 FENES (DRAPES) ×2 IMPLANT
DRAPE UTILITY XL STRL (DRAPES) ×2 IMPLANT
ELECT CAUTERY BLADE 6.4 (BLADE) ×2 IMPLANT
ELECT REM PT RETURN 9FT ADLT (ELECTROSURGICAL) ×2
ELECTRODE REM PT RTRN 9FT ADLT (ELECTROSURGICAL) ×1 IMPLANT
GLOVE SURG SIGNA 7.5 PF LTX (GLOVE) ×2 IMPLANT
GOWN STRL REUS W/ TWL LRG LVL3 (GOWN DISPOSABLE) ×1 IMPLANT
GOWN STRL REUS W/ TWL XL LVL3 (GOWN DISPOSABLE) ×1 IMPLANT
GOWN STRL REUS W/TWL LRG LVL3 (GOWN DISPOSABLE) ×1
GOWN STRL REUS W/TWL XL LVL3 (GOWN DISPOSABLE) ×1
KIT BASIN OR (CUSTOM PROCEDURE TRAY) ×2 IMPLANT
KIT MARKER MARGIN INK (KITS) ×2 IMPLANT
NEEDLE HYPO 25GX1X1/2 BEV (NEEDLE) ×2 IMPLANT
NS IRRIG 1000ML POUR BTL (IV SOLUTION) IMPLANT
PACK SURGICAL SETUP 50X90 (CUSTOM PROCEDURE TRAY) ×2 IMPLANT
PENCIL BUTTON HOLSTER BLD 10FT (ELECTRODE) ×2 IMPLANT
SPONGE LAP 18X18 X RAY DECT (DISPOSABLE) ×2 IMPLANT
SUT MNCRL AB 4-0 PS2 18 (SUTURE) ×2 IMPLANT
SUT VIC AB 3-0 SH 18 (SUTURE) ×2 IMPLANT
SYR BULB 3OZ (MISCELLANEOUS) ×2 IMPLANT
SYR CONTROL 10ML LL (SYRINGE) ×2 IMPLANT
TOWEL OR 17X24 6PK STRL BLUE (TOWEL DISPOSABLE) ×2 IMPLANT
TOWEL OR 17X26 10 PK STRL BLUE (TOWEL DISPOSABLE) ×2 IMPLANT
TUBE CONNECTING 12X1/4 (SUCTIONS) ×2 IMPLANT
YANKAUER SUCT BULB TIP NO VENT (SUCTIONS) ×2 IMPLANT

## 2017-12-27 NOTE — Op Note (Signed)
BREAST LUMPECTOMY WITH RADIOACTIVE SEED LOCALIZATION  Procedure Note  Virginia Olson 12/27/2017   Pre-op Diagnosis: left breast mass     Post-op Diagnosis: same  Procedure(s): LEFT BREAST LUMPECTOMY WITH RADIOACTIVE SEED LOCALIZATION  Surgeon(s): Coralie Keens, MD  Anesthesia: General  Staff:  Circulator: Hal Morales, RN Scrub Person: Teschner, Mindy K, CST  Estimated Blood Loss: Minimal               Specimens: sent to path  Indications: This is a 42 year old female who had undergone a stereotactic left breast biopsy for a mass.  The pathology showed a fibroadenoma.  This was felt to be discordant with the mammographic findings therefore excision of the mass was recommended  Procedure: The patient was brought to the operating room and identified as the correct patient.  She was placed supine on the operating table and general anesthesia was induced.  Her left breast and chest were prepped and draped in usual sterile fashion.  I identified the radioactive seed at the 12 o'clock position of the left breast with the neoprobe.  I anesthetized the skin at the upper edge of the areola and made an incision with a scalpel I then dissected down into the breast tissue with the electrocautery.  With the aid of the neoprobe, I was able to dissect down to the mass which was easily palpable.  I excised the mass in its entirety with a radioactive seed.  The seed fell off of the specimens were placed in a separate cup.  X-ray in the neoprobe again confirmed this with the seed.  We then x-rayed the specimen and confirmed that the other marker was in the specimen.  Again the mass was palpable.  This was sent to pathology for evaluation.  I achieved hemostasis with the cautery.  I then closed the subcutaneous tissue with interrupted 3-0 Vicryl sutures and closed the skin with a running 4-0 Monocryl.  Dermabond was then applied.  The patient tolerated the procedure well.  All the counts were correct at  the end of the procedure.  The patient was then extubated in the operating room and taken in a stable condition to the recovery room.          Virginia Olson A   Date: 12/27/2017  Time: 3:45 PM

## 2017-12-27 NOTE — Interval H&P Note (Signed)
History and Physical Interval Note:no change in H and P 12/27/2017 2:28 PM  Virginia Olson  has presented today for surgery, with the diagnosis of left breast mass  The various methods of treatment have been discussed with the patient and family. After consideration of risks, benefits and other options for treatment, the patient has consented to  Procedure(s): BREAST LUMPECTOMY WITH RADIOACTIVE SEED LOCALIZATION (Left) as a surgical intervention .  The patient's history has been reviewed, patient examined, no change in status, stable for surgery.  I have reviewed the patient's chart and labs.  Questions were answered to the patient's satisfaction.     Delsin Copen A

## 2017-12-27 NOTE — Anesthesia Procedure Notes (Signed)
Procedure Name: LMA Insertion Date/Time: 12/27/2017 3:10 PM Performed by: Imagene Riches, CRNA Pre-anesthesia Checklist: Patient identified, Emergency Drugs available, Suction available and Patient being monitored Patient Re-evaluated:Patient Re-evaluated prior to induction Oxygen Delivery Method: Circle System Utilized Preoxygenation: Pre-oxygenation with 100% oxygen Induction Type: IV induction LMA: LMA inserted LMA Size: 4.0 Number of attempts: 1 Airway Equipment and Method: Oral airway Placement Confirmation: positive ETCO2 Tube secured with: Tape Dental Injury: Teeth and Oropharynx as per pre-operative assessment  Comments: LMA 5 too large

## 2017-12-27 NOTE — Anesthesia Postprocedure Evaluation (Signed)
Anesthesia Post Note  Patient: Virginia Olson  Procedure(s) Performed: BREAST LUMPECTOMY WITH RADIOACTIVE SEED LOCALIZATION (Left Breast)     Patient location during evaluation: PACU Anesthesia Type: General Level of consciousness: awake and alert, oriented and patient cooperative Pain management: pain level controlled Vital Signs Assessment: post-procedure vital signs reviewed and stable Respiratory status: spontaneous breathing, nonlabored ventilation and respiratory function stable Cardiovascular status: blood pressure returned to baseline and stable Postop Assessment: no apparent nausea or vomiting Anesthetic complications: no    Last Vitals:  Vitals:   12/27/17 1623 12/27/17 1637  BP: 128/79 135/80  Pulse: 67 95  Resp: 16 14  Temp: 36.6 C   SpO2: 99% 94%    Last Pain:  Vitals:   12/27/17 1637  TempSrc:   PainSc: 0-No pain                 Gracynn Rajewski,E. Jabron Weese

## 2017-12-27 NOTE — Anesthesia Preprocedure Evaluation (Addendum)
Anesthesia Evaluation  Patient identified by MRN, date of birth, ID band Patient awake    Reviewed: Allergy & Precautions, NPO status , Patient's Chart, lab work & pertinent test results  History of Anesthesia Complications Negative for: history of anesthetic complications  Airway Mallampati: II  TM Distance: >3 FB Neck ROM: Full    Dental  (+) Dental Advisory Given   Pulmonary COPD,  COPD inhaler,    breath sounds clear to auscultation       Cardiovascular negative cardio ROS   Rhythm:Regular Rate:Normal     Neuro/Psych negative neurological ROS     GI/Hepatic Neg liver ROS, GERD  Medicated,  Endo/Other  Morbid obesity  Renal/GU negative Renal ROS     Musculoskeletal   Abdominal (+) + obese,   Peds  Hematology negative hematology ROS (+)   Anesthesia Other Findings   Reproductive/Obstetrics                           Anesthesia Physical Anesthesia Plan  ASA: III  Anesthesia Plan: General   Post-op Pain Management:    Induction: Intravenous  PONV Risk Score and Plan: 3 and Ondansetron, Dexamethasone and Scopolamine patch - Pre-op  Airway Management Planned: LMA  Additional Equipment:   Intra-op Plan:   Post-operative Plan:   Informed Consent: I have reviewed the patients History and Physical, chart, labs and discussed the procedure including the risks, benefits and alternatives for the proposed anesthesia with the patient or authorized representative who has indicated his/her understanding and acceptance.   Dental advisory given  Plan Discussed with: CRNA and Surgeon  Anesthesia Plan Comments: (Plan routine monitors, GA- LMA OK)        Anesthesia Quick Evaluation

## 2017-12-27 NOTE — Transfer of Care (Signed)
Immediate Anesthesia Transfer of Care Note  Patient: Virginia Olson  Procedure(s) Performed: BREAST LUMPECTOMY WITH RADIOACTIVE SEED LOCALIZATION (Left Breast)  Patient Location: PACU  Anesthesia Type:General  Level of Consciousness: awake, alert  and oriented  Airway & Oxygen Therapy: Patient Spontanous Breathing and Patient connected to nasal cannula oxygen  Post-op Assessment: Report given to RN and Post -op Vital signs reviewed and stable  Post vital signs: Reviewed and stable  Last Vitals:  Vitals Value Taken Time  BP 131/85 12/27/2017  3:54 PM  Temp    Pulse 90 12/27/2017  3:54 PM  Resp 11 12/27/2017  3:54 PM  SpO2 100 % 12/27/2017  3:54 PM  Vitals shown include unvalidated device data.  Last Pain:  Vitals:   12/27/17 1254  TempSrc: Oral      Patients Stated Pain Goal: 10 (88/91/69 4503)  Complications: No apparent anesthesia complications

## 2017-12-27 NOTE — Discharge Instructions (Signed)
[place] Office Phone Number [phone]  BREAST BIOPSY/ PARTIAL MASTECTOMY: POST OP INSTRUCTIONS  Always review your discharge instruction sheet given to you by the facility where your surgery was performed.  IF YOU HAVE DISABILITY OR FAMILY LEAVE FORMS, YOU MUST BRING THEM TO THE OFFICE FOR PROCESSING.  DO NOT GIVE THEM TO YOUR DOCTOR.  1. A prescription for pain medication may be given to you upon discharge.  Take your pain medication as prescribed, if needed.  If narcotic pain medicine is not needed, then you may take acetaminophen (Tylenol) or ibuprofen (Advil) as needed. 2. Take your usually prescribed medications unless otherwise directed 3. If you need a refill on your pain medication, please contact your pharmacy.  They will contact our office to request authorization.  Prescriptions will not be filled after 5pm or on week-ends. 4. You should eat very light the first 24 hours after surgery, such as soup, crackers, pudding, etc.  Resume your normal diet the day after surgery. 5. Most patients will experience some swelling and bruising in the breast.  Ice packs and a good support bra will help.  Swelling and bruising can take several days to resolve.  6. It is common to experience some constipation if taking pain medication after surgery.  Increasing fluid intake and taking a stool softener will usually help or prevent this problem from occurring.  A mild laxative (Milk of Magnesia or Miralax) should be taken according to package directions if there are no bowel movements after 48 hours. 7. Unless discharge instructions indicate otherwise, you may remove your bandages 24-48 hours after surgery, and you may shower at that time.  You may have steri-strips (small skin tapes) in place directly over the incision.  These strips should be left on the skin for 7-10 days.  If your surgeon used skin glue on the incision, you may shower in 24 hours.  The glue will flake off over the  next 2-3 weeks.  Any sutures or staples will be removed at the office during your follow-up visit. 8. ACTIVITIES:  You may resume regular daily activities (gradually increasing) beginning the next day.  Wearing a good support bra or sports bra minimizes pain and swelling.  You may have sexual intercourse when it is comfortable. a. You may drive when you no longer are taking prescription pain medication, you can comfortably wear a seatbelt, and you can safely maneuver your car and apply brakes. b. RETURN TO WORK:  ______________________________________________________________________________________ 9. You should see your doctor in the office for a follow-up appointment approximately two weeks after your surgery.  Your doctors nurse will typically make your follow-up appointment when she calls you with your pathology report.  Expect your pathology report 2-3 business days after your surgery.  You may call to check if you do not hear from [place] after three days. 10. OTHER INSTRUCTIONS: __OK TO SHOWER TOMORROW 11. ICE PACK, TYLENOL, IBUPROFEN ALSO FOR PAIN 12. _____________________________________________________________________________________________ _____________________________________________________________________________________________________________________________________ _____________________________________________________________________________________________________________________________________ _____________________________________________________________________________________________________________________________________  WHEN TO CALL YOUR DOCTOR: 1. Fever over 101.0 2. Nausea and/or vomiting. 3. Extreme swelling or bruising. 4. Continued bleeding from incision. 5. Increased pain, redness, or drainage from the incision.  The clinic staff is available to answer your questions during regular business hours.  Please dont hesitate to call and ask to speak to one of the nurses for  clinical concerns.  If you have a medical emergency, go to the nearest emergency room or call 911.  A surgeon from [facility] is always  on call at the hospital.  For further questions, please visit [email or web address]

## 2017-12-28 ENCOUNTER — Encounter (HOSPITAL_COMMUNITY): Payer: Self-pay | Admitting: Surgery

## 2018-01-05 DIAGNOSIS — F439 Reaction to severe stress, unspecified: Secondary | ICD-10-CM | POA: Diagnosis not present

## 2018-01-05 DIAGNOSIS — J45909 Unspecified asthma, uncomplicated: Secondary | ICD-10-CM | POA: Diagnosis not present

## 2018-01-05 DIAGNOSIS — Z23 Encounter for immunization: Secondary | ICD-10-CM | POA: Diagnosis not present

## 2018-02-12 DIAGNOSIS — J45909 Unspecified asthma, uncomplicated: Secondary | ICD-10-CM | POA: Diagnosis not present

## 2018-02-12 DIAGNOSIS — F439 Reaction to severe stress, unspecified: Secondary | ICD-10-CM | POA: Diagnosis not present

## 2018-02-12 DIAGNOSIS — Z6841 Body Mass Index (BMI) 40.0 and over, adult: Secondary | ICD-10-CM | POA: Diagnosis not present

## 2018-04-30 DIAGNOSIS — J0110 Acute frontal sinusitis, unspecified: Secondary | ICD-10-CM | POA: Diagnosis not present

## 2018-09-24 DIAGNOSIS — N764 Abscess of vulva: Secondary | ICD-10-CM | POA: Diagnosis not present

## 2018-12-19 DIAGNOSIS — J45909 Unspecified asthma, uncomplicated: Secondary | ICD-10-CM | POA: Diagnosis not present

## 2018-12-19 DIAGNOSIS — Z6841 Body Mass Index (BMI) 40.0 and over, adult: Secondary | ICD-10-CM | POA: Diagnosis not present

## 2018-12-19 DIAGNOSIS — B009 Herpesviral infection, unspecified: Secondary | ICD-10-CM | POA: Diagnosis not present

## 2019-03-01 IMAGING — MG DIGITAL SCREENING BILATERAL MAMMOGRAM WITH CAD
4 series · 4 of 4 positions shown · non-contrast
Comparison: None.

CLINICAL DATA: Screening.

EXAM:
DIGITAL SCREENING BILATERAL MAMMOGRAM WITH CAD

[R MLO]
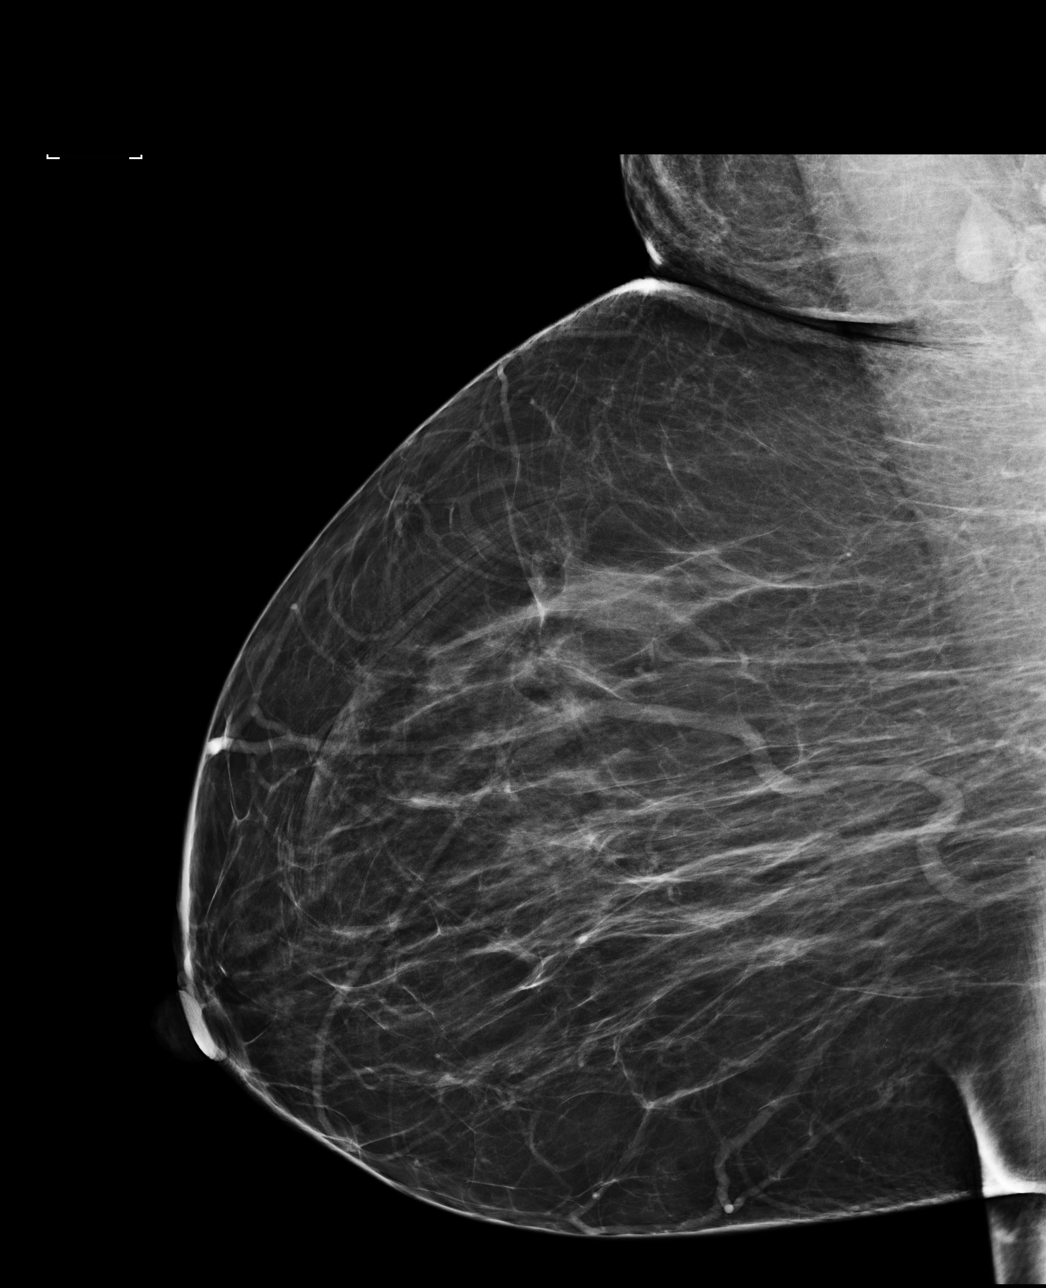

[R CC]
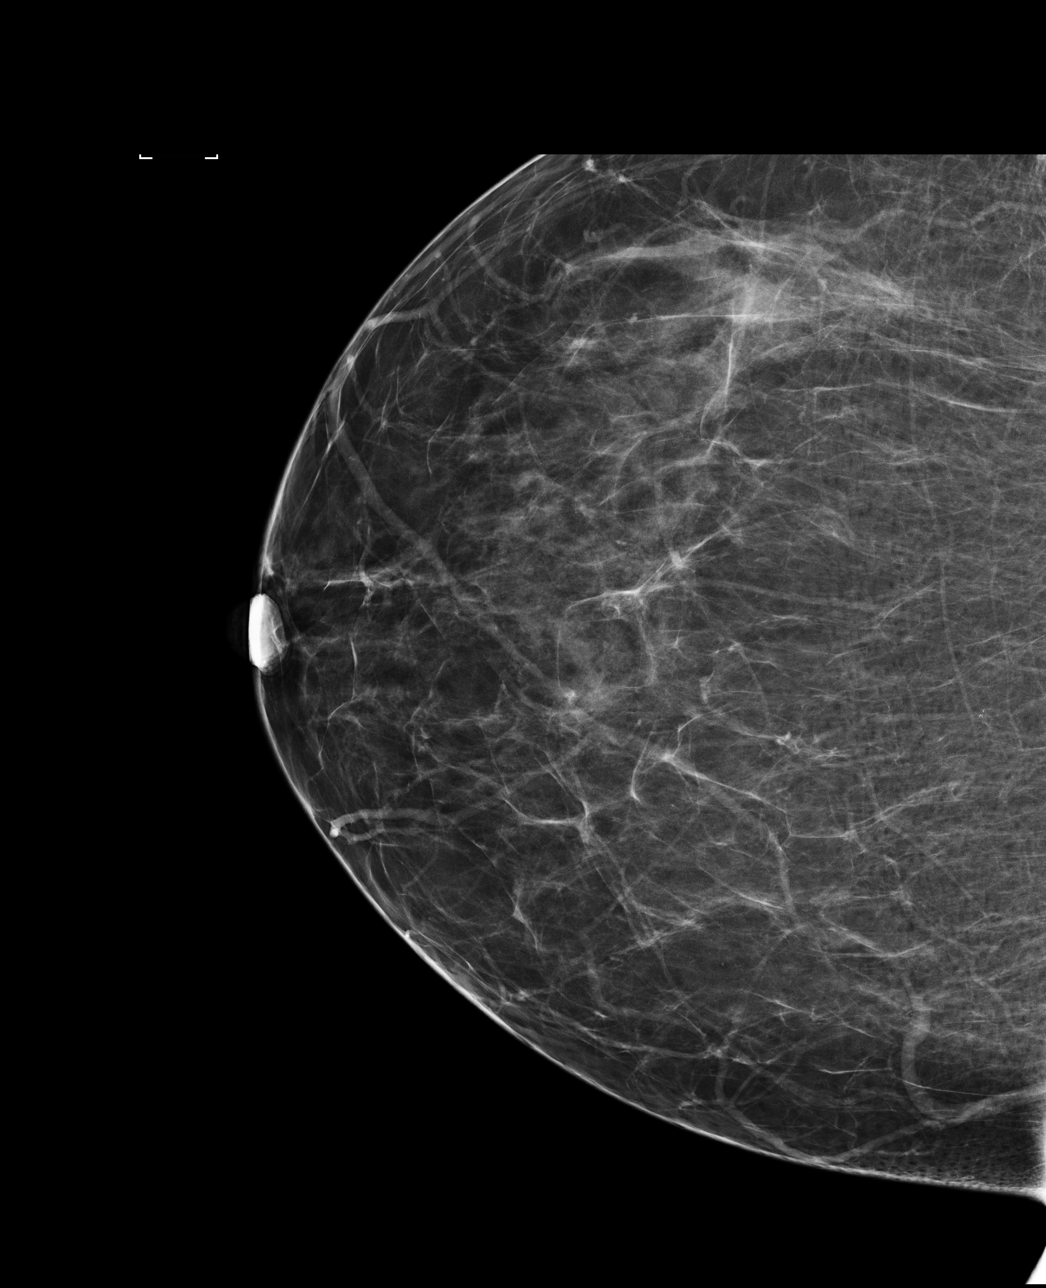

[L MLO]
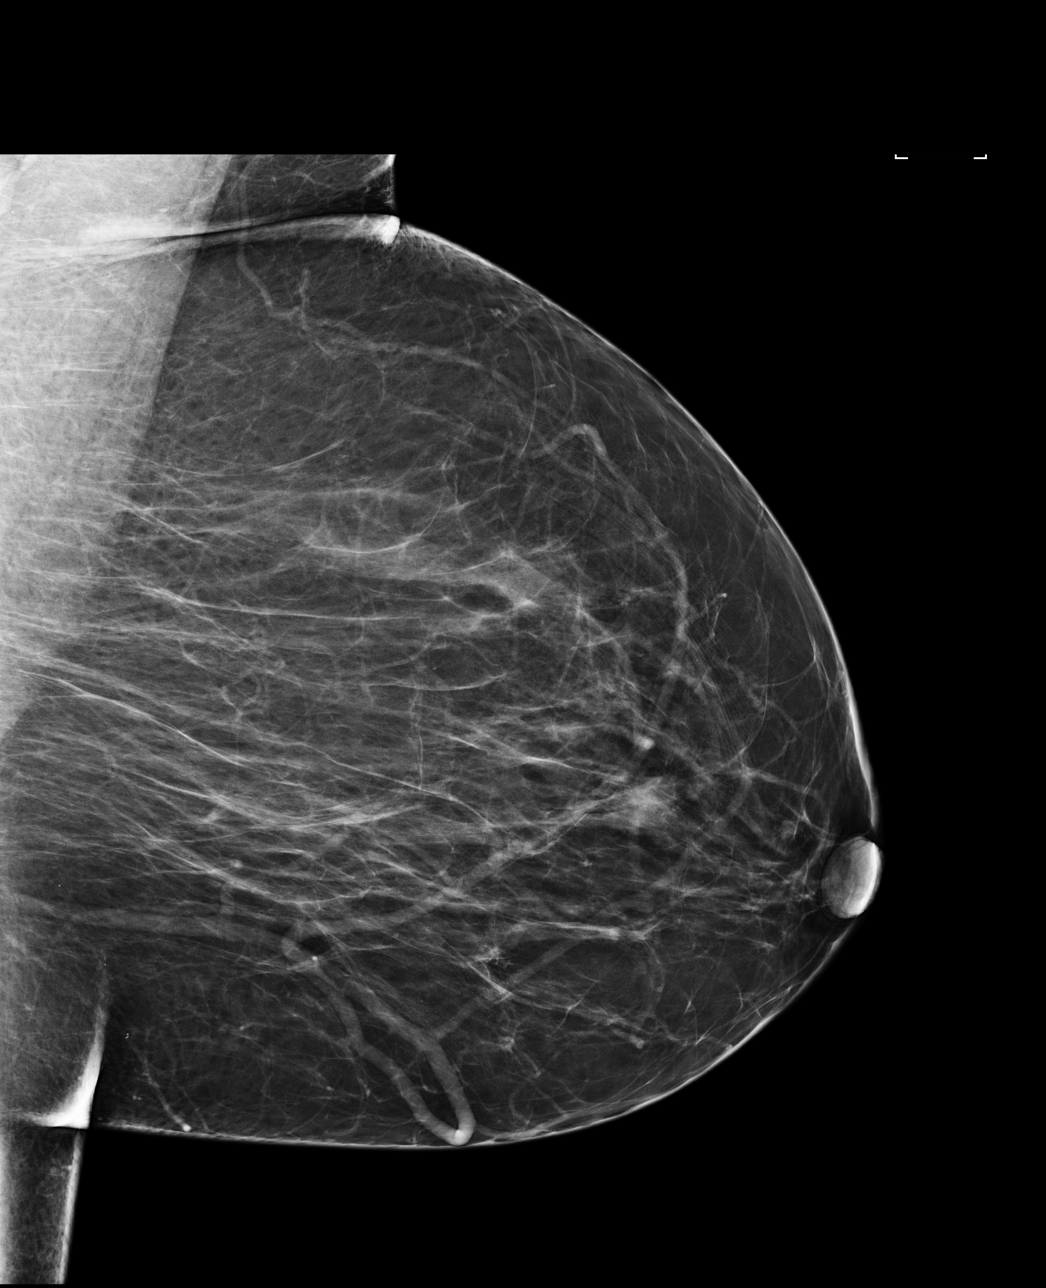

[L CC]
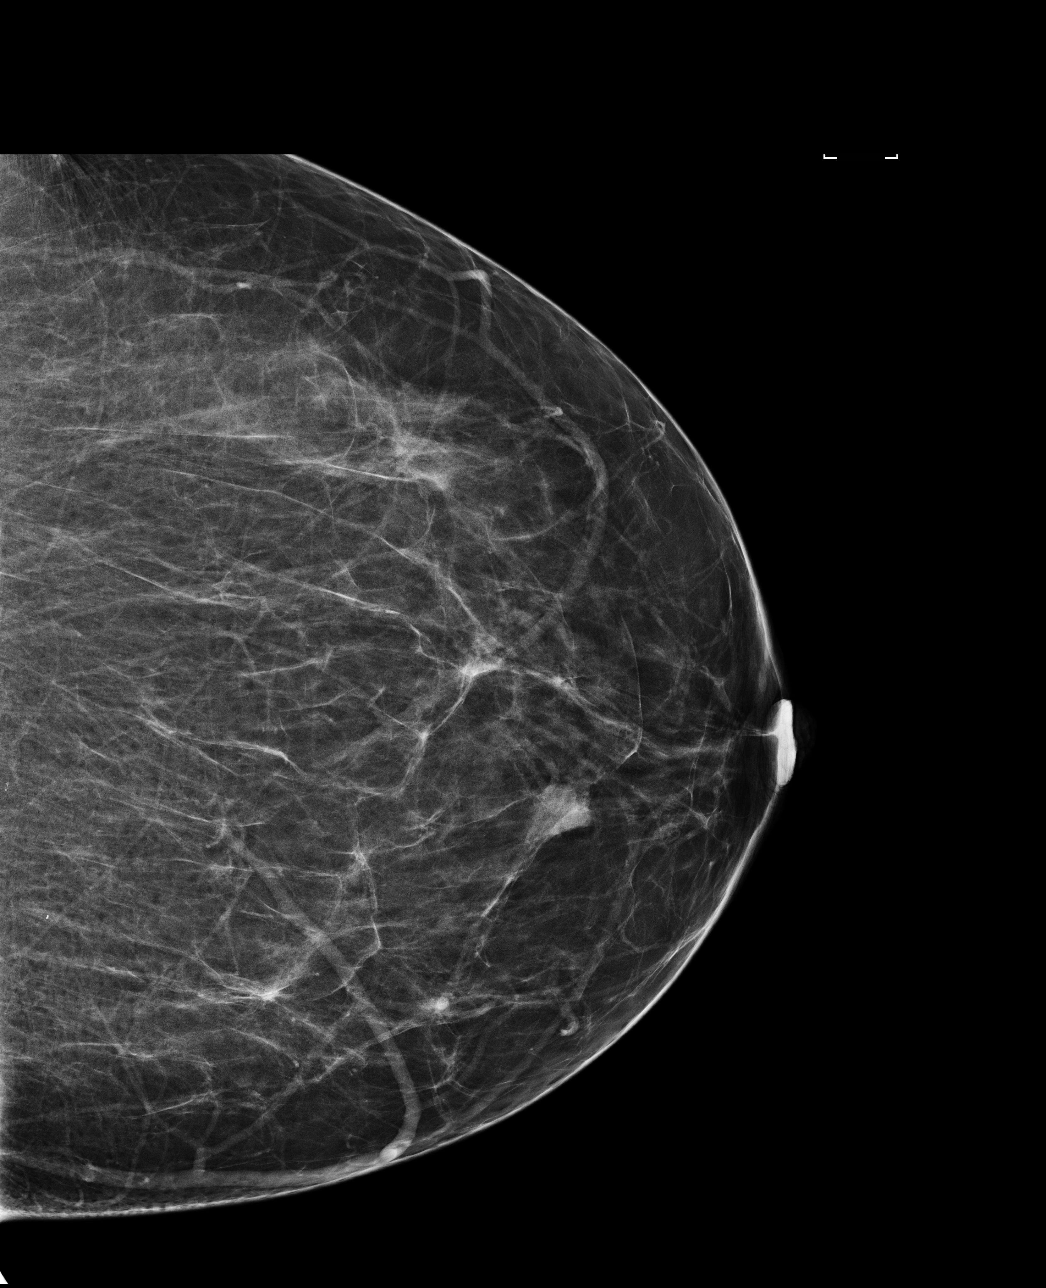

[4 of 4 positions shown; findings below may reference images not displayed]

ACR Breast Density Category b: There are scattered areas of
fibroglandular density.
FINDINGS: In the left breast, a possible mass warrants further evaluation. In
the right breast, no findings suspicious for malignancy.

Images were processed with CAD.
IMPRESSION: Further evaluation is suggested for possible mass in the left
breast.

RECOMMENDATION:
Diagnostic mammogram and possibly ultrasound of the left breast.
(Code:[id number]-5-00R)

The patient will be contacted regarding the findings, and additional
imaging will be scheduled.

BI-RADS CATEGORY  0: Incomplete. Need additional imaging evaluation
and/or prior mammograms for comparison.

## 2019-07-19 DIAGNOSIS — L304 Erythema intertrigo: Secondary | ICD-10-CM | POA: Diagnosis not present

## 2019-07-19 DIAGNOSIS — Z23 Encounter for immunization: Secondary | ICD-10-CM | POA: Diagnosis not present

## 2019-09-04 ENCOUNTER — Other Ambulatory Visit: Payer: Self-pay

## 2019-09-04 DIAGNOSIS — Z20822 Contact with and (suspected) exposure to covid-19: Secondary | ICD-10-CM

## 2019-09-07 LAB — NOVEL CORONAVIRUS, NAA: SARS-CoV-2, NAA: NOT DETECTED

## 2019-10-22 DIAGNOSIS — M25562 Pain in left knee: Secondary | ICD-10-CM | POA: Diagnosis not present

## 2019-10-22 DIAGNOSIS — M67912 Unspecified disorder of synovium and tendon, left shoulder: Secondary | ICD-10-CM | POA: Diagnosis not present

## 2019-10-22 DIAGNOSIS — M1712 Unilateral primary osteoarthritis, left knee: Secondary | ICD-10-CM | POA: Diagnosis not present

## 2019-11-05 DIAGNOSIS — M67432 Ganglion, left wrist: Secondary | ICD-10-CM | POA: Diagnosis not present

## 2019-12-08 DIAGNOSIS — R319 Hematuria, unspecified: Secondary | ICD-10-CM | POA: Diagnosis not present

## 2020-01-16 DIAGNOSIS — B351 Tinea unguium: Secondary | ICD-10-CM | POA: Diagnosis not present

## 2020-01-16 DIAGNOSIS — Z79899 Other long term (current) drug therapy: Secondary | ICD-10-CM | POA: Diagnosis not present

## 2020-01-16 DIAGNOSIS — M25572 Pain in left ankle and joints of left foot: Secondary | ICD-10-CM | POA: Diagnosis not present

## 2020-01-16 DIAGNOSIS — Z01812 Encounter for preprocedural laboratory examination: Secondary | ICD-10-CM | POA: Diagnosis not present

## 2020-01-29 DIAGNOSIS — N3001 Acute cystitis with hematuria: Secondary | ICD-10-CM | POA: Diagnosis not present

## 2020-01-29 DIAGNOSIS — R350 Frequency of micturition: Secondary | ICD-10-CM | POA: Diagnosis not present

## 2020-02-11 DIAGNOSIS — S86812A Strain of other muscle(s) and tendon(s) at lower leg level, left leg, initial encounter: Secondary | ICD-10-CM | POA: Diagnosis not present

## 2020-02-11 DIAGNOSIS — M25562 Pain in left knee: Secondary | ICD-10-CM | POA: Diagnosis not present

## 2020-02-13 DIAGNOSIS — M25572 Pain in left ankle and joints of left foot: Secondary | ICD-10-CM | POA: Diagnosis not present

## 2020-02-13 DIAGNOSIS — T8484XA Pain due to internal orthopedic prosthetic devices, implants and grafts, initial encounter: Secondary | ICD-10-CM | POA: Diagnosis not present

## 2020-02-13 DIAGNOSIS — J45909 Unspecified asthma, uncomplicated: Secondary | ICD-10-CM | POA: Diagnosis not present

## 2020-02-17 DIAGNOSIS — M25571 Pain in right ankle and joints of right foot: Secondary | ICD-10-CM | POA: Diagnosis not present

## 2020-02-26 DIAGNOSIS — M25562 Pain in left knee: Secondary | ICD-10-CM | POA: Diagnosis not present

## 2020-02-26 DIAGNOSIS — M1712 Unilateral primary osteoarthritis, left knee: Secondary | ICD-10-CM | POA: Diagnosis not present

## 2020-03-05 DIAGNOSIS — N3000 Acute cystitis without hematuria: Secondary | ICD-10-CM | POA: Diagnosis not present

## 2020-03-05 DIAGNOSIS — N898 Other specified noninflammatory disorders of vagina: Secondary | ICD-10-CM | POA: Diagnosis not present

## 2020-03-06 DIAGNOSIS — M25561 Pain in right knee: Secondary | ICD-10-CM | POA: Diagnosis not present

## 2020-03-10 DIAGNOSIS — M1712 Unilateral primary osteoarthritis, left knee: Secondary | ICD-10-CM | POA: Diagnosis not present

## 2020-03-11 DIAGNOSIS — M1712 Unilateral primary osteoarthritis, left knee: Secondary | ICD-10-CM | POA: Diagnosis not present

## 2020-03-12 DIAGNOSIS — M1712 Unilateral primary osteoarthritis, left knee: Secondary | ICD-10-CM | POA: Diagnosis not present

## 2020-03-16 DIAGNOSIS — M1712 Unilateral primary osteoarthritis, left knee: Secondary | ICD-10-CM | POA: Diagnosis not present

## 2020-03-18 DIAGNOSIS — M1712 Unilateral primary osteoarthritis, left knee: Secondary | ICD-10-CM | POA: Diagnosis not present

## 2020-03-23 DIAGNOSIS — M1712 Unilateral primary osteoarthritis, left knee: Secondary | ICD-10-CM | POA: Diagnosis not present

## 2020-03-26 DIAGNOSIS — M1712 Unilateral primary osteoarthritis, left knee: Secondary | ICD-10-CM | POA: Diagnosis not present

## 2020-03-30 DIAGNOSIS — M1712 Unilateral primary osteoarthritis, left knee: Secondary | ICD-10-CM | POA: Diagnosis not present

## 2020-04-01 DIAGNOSIS — M1712 Unilateral primary osteoarthritis, left knee: Secondary | ICD-10-CM | POA: Diagnosis not present

## 2020-04-10 DIAGNOSIS — M1712 Unilateral primary osteoarthritis, left knee: Secondary | ICD-10-CM | POA: Diagnosis not present

## 2020-04-10 DIAGNOSIS — M25562 Pain in left knee: Secondary | ICD-10-CM | POA: Diagnosis not present

## 2020-04-23 DIAGNOSIS — Z0000 Encounter for general adult medical examination without abnormal findings: Secondary | ICD-10-CM | POA: Diagnosis not present

## 2020-04-23 DIAGNOSIS — Z13220 Encounter for screening for lipoid disorders: Secondary | ICD-10-CM | POA: Diagnosis not present

## 2020-05-08 DIAGNOSIS — M1712 Unilateral primary osteoarthritis, left knee: Secondary | ICD-10-CM | POA: Diagnosis not present

## 2020-05-08 DIAGNOSIS — M25462 Effusion, left knee: Secondary | ICD-10-CM | POA: Diagnosis not present

## 2020-06-15 DIAGNOSIS — H02849 Edema of unspecified eye, unspecified eyelid: Secondary | ICD-10-CM | POA: Diagnosis not present

## 2020-06-15 DIAGNOSIS — Z79899 Other long term (current) drug therapy: Secondary | ICD-10-CM | POA: Diagnosis not present

## 2020-06-15 DIAGNOSIS — L700 Acne vulgaris: Secondary | ICD-10-CM | POA: Diagnosis not present

## 2020-07-16 DIAGNOSIS — L700 Acne vulgaris: Secondary | ICD-10-CM | POA: Diagnosis not present

## 2020-07-16 DIAGNOSIS — M25462 Effusion, left knee: Secondary | ICD-10-CM | POA: Diagnosis not present

## 2020-07-16 DIAGNOSIS — M1712 Unilateral primary osteoarthritis, left knee: Secondary | ICD-10-CM | POA: Diagnosis not present

## 2020-07-16 DIAGNOSIS — Z79899 Other long term (current) drug therapy: Secondary | ICD-10-CM | POA: Diagnosis not present

## 2020-08-29 DIAGNOSIS — N764 Abscess of vulva: Secondary | ICD-10-CM | POA: Diagnosis not present

## 2020-09-10 DIAGNOSIS — M1712 Unilateral primary osteoarthritis, left knee: Secondary | ICD-10-CM | POA: Diagnosis not present

## 2020-09-10 DIAGNOSIS — M25462 Effusion, left knee: Secondary | ICD-10-CM | POA: Diagnosis not present

## 2020-09-17 DIAGNOSIS — L700 Acne vulgaris: Secondary | ICD-10-CM | POA: Diagnosis not present

## 2020-09-17 DIAGNOSIS — Z79899 Other long term (current) drug therapy: Secondary | ICD-10-CM | POA: Diagnosis not present

## 2020-10-08 DIAGNOSIS — Z20822 Contact with and (suspected) exposure to covid-19: Secondary | ICD-10-CM | POA: Diagnosis not present

## 2020-10-08 DIAGNOSIS — R0989 Other specified symptoms and signs involving the circulatory and respiratory systems: Secondary | ICD-10-CM | POA: Diagnosis not present

## 2020-10-08 DIAGNOSIS — R0789 Other chest pain: Secondary | ICD-10-CM | POA: Diagnosis not present

## 2020-10-08 DIAGNOSIS — R52 Pain, unspecified: Secondary | ICD-10-CM | POA: Diagnosis not present

## 2020-11-10 DIAGNOSIS — R8290 Unspecified abnormal findings in urine: Secondary | ICD-10-CM | POA: Diagnosis not present

## 2020-11-17 DIAGNOSIS — Z79899 Other long term (current) drug therapy: Secondary | ICD-10-CM | POA: Diagnosis not present

## 2020-11-17 DIAGNOSIS — L700 Acne vulgaris: Secondary | ICD-10-CM | POA: Diagnosis not present

## 2020-12-21 DIAGNOSIS — L700 Acne vulgaris: Secondary | ICD-10-CM | POA: Diagnosis not present

## 2020-12-21 DIAGNOSIS — Z79899 Other long term (current) drug therapy: Secondary | ICD-10-CM | POA: Diagnosis not present

## 2020-12-21 DIAGNOSIS — Z411 Encounter for cosmetic surgery: Secondary | ICD-10-CM | POA: Diagnosis not present

## 2020-12-23 DIAGNOSIS — M25562 Pain in left knee: Secondary | ICD-10-CM | POA: Diagnosis not present

## 2020-12-23 DIAGNOSIS — M25561 Pain in right knee: Secondary | ICD-10-CM | POA: Diagnosis not present

## 2021-01-03 ENCOUNTER — Emergency Department (HOSPITAL_COMMUNITY)
Admission: EM | Admit: 2021-01-03 | Discharge: 2021-01-03 | Disposition: A | Discharge status: Home / Self Care | Payer: BC Managed Care – PPO | Attending: Emergency Medicine | Admitting: Emergency Medicine

## 2021-01-03 ENCOUNTER — Emergency Department (HOSPITAL_COMMUNITY): Payer: BC Managed Care – PPO

## 2021-01-03 ENCOUNTER — Other Ambulatory Visit: Payer: Self-pay

## 2021-01-03 ENCOUNTER — Encounter (HOSPITAL_COMMUNITY): Payer: Self-pay | Admitting: Emergency Medicine

## 2021-01-03 DIAGNOSIS — R519 Headache, unspecified: Secondary | ICD-10-CM

## 2021-01-03 DIAGNOSIS — M5021 Other cervical disc displacement,  high cervical region: Secondary | ICD-10-CM | POA: Diagnosis not present

## 2021-01-03 DIAGNOSIS — M50221 Other cervical disc displacement at C4-C5 level: Secondary | ICD-10-CM | POA: Diagnosis not present

## 2021-01-03 DIAGNOSIS — R202 Paresthesia of skin: Secondary | ICD-10-CM | POA: Diagnosis not present

## 2021-01-03 DIAGNOSIS — N390 Urinary tract infection, site not specified: Secondary | ICD-10-CM | POA: Diagnosis not present

## 2021-01-03 DIAGNOSIS — R29818 Other symptoms and signs involving the nervous system: Secondary | ICD-10-CM | POA: Diagnosis not present

## 2021-01-03 DIAGNOSIS — J45909 Unspecified asthma, uncomplicated: Secondary | ICD-10-CM | POA: Diagnosis not present

## 2021-01-03 DIAGNOSIS — M50222 Other cervical disc displacement at C5-C6 level: Secondary | ICD-10-CM | POA: Diagnosis not present

## 2021-01-03 DIAGNOSIS — G35 Multiple sclerosis: Secondary | ICD-10-CM | POA: Diagnosis not present

## 2021-01-03 DIAGNOSIS — Z79899 Other long term (current) drug therapy: Secondary | ICD-10-CM | POA: Insufficient documentation

## 2021-01-03 DIAGNOSIS — I10 Essential (primary) hypertension: Secondary | ICD-10-CM | POA: Diagnosis not present

## 2021-01-03 LAB — CBC WITH DIFFERENTIAL/PLATELET
Abs Immature Granulocytes: 0.01 10*3/uL (ref 0.00–0.07)
Basophils Absolute: 0 10*3/uL (ref 0.0–0.1)
Basophils Relative: 1 %
Eosinophils Absolute: 0 10*3/uL (ref 0.0–0.5)
Eosinophils Relative: 1 %
HCT: 38.5 % (ref 36.0–46.0)
Hemoglobin: 12.7 g/dL (ref 12.0–15.0)
Immature Granulocytes: 0 %
Lymphocytes Relative: 44 %
Lymphs Abs: 2.3 10*3/uL (ref 0.7–4.0)
MCH: 30 pg (ref 26.0–34.0)
MCHC: 33 g/dL (ref 30.0–36.0)
MCV: 90.8 fL (ref 80.0–100.0)
Monocytes Absolute: 0.6 10*3/uL (ref 0.1–1.0)
Monocytes Relative: 11 %
Neutro Abs: 2.1 10*3/uL (ref 1.7–7.7)
Neutrophils Relative %: 43 %
Platelets: 369 10*3/uL (ref 150–400)
RBC: 4.24 MIL/uL (ref 3.87–5.11)
RDW: 13.2 % (ref 11.5–15.5)
WBC: 5 10*3/uL (ref 4.0–10.5)
nRBC: 0 % (ref 0.0–0.2)

## 2021-01-03 LAB — URINALYSIS, ROUTINE W REFLEX MICROSCOPIC
Bilirubin Urine: NEGATIVE
Glucose, UA: NEGATIVE mg/dL
Hgb urine dipstick: NEGATIVE
Ketones, ur: NEGATIVE mg/dL
Nitrite: POSITIVE — AB
Protein, ur: NEGATIVE mg/dL
Specific Gravity, Urine: 1.014 (ref 1.005–1.030)
pH: 7 (ref 5.0–8.0)

## 2021-01-03 LAB — BASIC METABOLIC PANEL
Anion gap: 6 (ref 5–15)
BUN: 18 mg/dL (ref 6–20)
CO2: 27 mmol/L (ref 22–32)
Calcium: 9.6 mg/dL (ref 8.9–10.3)
Chloride: 102 mmol/L (ref 98–111)
Creatinine, Ser: 1.14 mg/dL — ABNORMAL HIGH (ref 0.44–1.00)
GFR, Estimated: >60 mL/min (ref >60)
Glucose, Bld: 101 mg/dL — ABNORMAL HIGH (ref 70–99)
Potassium: 5 mmol/L (ref 3.5–5.1)
Sodium: 135 mmol/L (ref 135–145)

## 2021-01-03 LAB — MAGNESIUM: Magnesium: 2 mg/dL (ref 1.7–2.4)

## 2021-01-03 LAB — I-STAT BETA HCG BLOOD, ED (MC, WL, AP ONLY): I-stat hCG, quantitative: <5 m[IU]/mL (ref <5)

## 2021-01-03 MED ORDER — CEPHALEXIN 500 MG PO CAPS
500.0000 mg | ORAL_CAPSULE | Freq: Once | ORAL | Status: AC
  Administered 2021-01-03: 500 mg via ORAL
  Filled 2021-01-03: qty 1

## 2021-01-03 MED ORDER — CEPHALEXIN 500 MG PO CAPS: 500.0000 mg | ORAL_CAPSULE | Freq: Two times a day (BID) | ORAL | 0 refills | Status: AC

## 2021-01-03 MED ORDER — MIDAZOLAM HCL 2 MG/2ML IJ SOLN
2.0000 mg | Freq: Once | INTRAMUSCULAR | Status: AC | PRN
  Administered 2021-01-03: 2 mg via INTRAVENOUS
  Filled 2021-01-03: qty 2

## 2021-01-03 NOTE — ED Triage Notes (Signed)
Patient reports generalized weakness with headache and numbness to left side of body since spin class yesterday at noon.

## 2021-01-03 NOTE — ED Provider Notes (Signed)
Patient placed in Quick Look pathway, seen and evaluated   Chief Complaint: numbness   HPI:   Pt reports left sided body numbness yesterday while exercising Pt is on spironalactone and metformin.  Pt concerned about her potassium  ROS: no fever or chills  Physical Exam:   Gen: No distress  Neuro: Awake and Alert  Skin: Warm    Focused Exam:    Initiation of care has begun. The patient has been counseled on the process, plan, and necessity for staying for the completion/evaluation, and the remainder of the medical screening examination MSE was initiated and I personally evaluated the patient and placed orders (if any) at  11:02 AM on January 03, 2021.  The patient appears stable so that the remainder of the MSE may be completed by another provider.   Fransico Meadow, [place] 01/03/21 1104    Wyvonnia Dusky, MD 01/03/21 [phone]

## 2021-01-03 NOTE — ED Provider Notes (Signed)
[place] DEPT Provider Note   CSN: [id number] Arrival date & time: 01/03/21  1042     History Chief Complaint  Patient presents with  . Headache  . Weakness    Virginia Olson is a 45 y.o. female on spironolactone for acne, metformin for weight loss, presenting to the ED with headache and left sided numbness.  She reports onset of symptoms with left face, left arm, and left leg paresthesias yesterday morning at 0900 during spin cycle class.  She reports mild nonspecific headache since onset of her symptoms.  She states that the left facial paresthesia has gone away, and the numbness in her left arm and leg have improved over the past 24 hours.  She went to urgent care today and was told to come to the ED immediately for evaluation.  She has never had this kind of symptoms before.  She denies any history of hypertension, hyperlipidemia, TIA, stroke, smoking.    She denies weakness of the left arm or leg with a sports they felt "heavy".  She states that she does take spironolactone for acne and is wondering if her electrolytes are potassium a bit low.  Although she has not had this problem in the past.  She denies any recent fevers, chills, cough, congestion, diarrhea.  She has had 3 doses of the COVID vaccines.  HPI     Past Medical History:  Diagnosis Date  . Anemia   . Asthma    bronchitis    . Endometriosis   . Fibroids 12/12/2014    Patient Active Problem List   Diagnosis Date Noted  . Menorrhagia 04/16/2015  . S/P laparoscopic hysterectomy 04/15/2015  . Ileitis 12/12/2014  . Abdominal pain 12/12/2014  . Nausea and vomiting 12/12/2014  . Hypokalemia 12/12/2014  . Anemia 12/12/2014  . Fibroids 12/12/2014  . Chills   . Nausea with vomiting   . Dehydration   . Tremor 09/16/2014    Past Surgical History:  Procedure Laterality Date  . ABDOMINAL HYSTERECTOMY    . BREAST LUMPECTOMY WITH RADIOACTIVE SEED LOCALIZATION Left 12/27/2017    Procedure: BREAST LUMPECTOMY WITH RADIOACTIVE SEED LOCALIZATION;  Surgeon: Coralie Keens, MD;  Location: [facility];  Service: General;  Laterality: Left;  . IUD REMOVAL N/A 04/15/2015   Procedure: INTRAUTERINE DEVICE (IUD) REMOVAL;  Surgeon: Christophe Louis, MD;  Location: [facility];  Service: Gynecology;  Laterality: N/A;  . laproscopy    . ROBOTIC ASSISTED TOTAL HYSTERECTOMY WITH BILATERAL SALPINGO OOPHERECTOMY Right 04/15/2015   Procedure: ROBOTIC ASSISTED TOTAL HYSTERECTOMY WITH BILATERAL SALPINGOECTOMY and RIGHT OOOPHORECTOMY;  Surgeon: Christophe Louis, MD;  Location: [facility];  Service: Gynecology;  Laterality: Right;  . TUBAL LIGATION       OB History   No obstetric history on file.     Family History  Problem Relation Age of Onset  . Diabetes Father   . Hypertension Father   . Anemia Mother   . Thyroid disease Neg Hx     Social History   Tobacco Use  . Smoking status: Never Smoker  . Smokeless tobacco: Never Used  Vaping Use  . Vaping Use: Never used  Substance Use Topics  . Alcohol use: Yes    Alcohol/week: 0.0 standard drinks    Comment: rarely  . Drug use: No    Home Medications Prior to Admission medications   Medication Sig Start Date End Date Taking? Authorizing Provider  acetaminophen (TYLENOL) 325 MG tablet Take 650 mg by mouth every 6 (six)  hours as needed for mild pain, moderate pain or headache.   Yes [provider]  albuterol (PROVENTIL HFA;VENTOLIN HFA) 108 (90 BASE) MCG/ACT inhaler Inhale 2 puffs into the lungs every 6 (six) hours as needed for wheezing or shortness of breath.    Yes [provider]  budesonide-formoterol (SYMBICORT) 80-4.5 MCG/ACT inhaler Inhale 2 puffs into the lungs 2 (two) times daily as needed for shortness of breath. 01/05/18  Yes [provider]  cephALEXin (KEFLEX) 500 MG capsule Take 1 capsule (500 mg total) by mouth 2 (two) times daily for 5 days. 01/03/21 01/08/21 Yes Cashmere Dingley, Carola Rhine, MD  CRANBERRY PO Take 1 tablet by  mouth daily.   Yes [provider]  FIBER PO Take 1 tablet by mouth daily.   Yes [provider]  metFORMIN (GLUCOPHAGE) 500 MG tablet Take 1 tablet by mouth 2 (two) times daily with a meal.   Yes [provider]  Probiotic Product (PROBIOTIC PO) Take 1 capsule by mouth daily.   Yes [provider]  S-Adenosylmethionine-B6-B12-FA (MOOD PLUS STRESS RELIEF PO) Take 1 tablet by mouth daily.   Yes [provider]  spironolactone (ALDACTONE) 100 MG tablet Take 100 mg by mouth 2 (two) times daily. 12/25/20  Yes [provider]  valACYclovir (VALTREX) 1000 MG tablet Take 1 tablet by mouth 2 (two) times daily as needed (cold sore). 07/10/17  Yes [provider]  oxyCODONE (OXY IR/ROXICODONE) 5 MG immediate release tablet Take 1-2 tablets (5-10 mg total) by mouth every 6 (six) hours as needed for moderate pain or severe pain. Patient not taking: No sig reported 12/27/17   Coralie Keens, MD    Allergies    Patient has no known allergies.  Review of Systems   Review of Systems  Constitutional: Negative for chills and fever.  Eyes: Negative for pain and visual disturbance.  Respiratory: Negative for cough and shortness of breath.   Cardiovascular: Negative for chest pain and palpitations.  Gastrointestinal: Negative for nausea and vomiting.  Genitourinary: Negative for dysuria and hematuria.  Musculoskeletal: Negative for arthralgias and back pain.  Skin: Negative for color change and rash.  Neurological: Positive for facial asymmetry, light-headedness, numbness and headaches. Negative for seizures, syncope and speech difficulty.  All other systems reviewed and are negative.   Physical Exam Updated Vital Signs BP 113/74 (BP Location: Right Arm)   Pulse 68   Temp 98.4 F (36.9 C) (Oral)   Resp 17   LMP 03/16/2015 (Approximate)   SpO2 99%   Physical Exam Constitutional:      General: She is not in acute distress. HENT:      Head: Normocephalic and atraumatic.  Eyes:     General: No visual field deficit.    Conjunctiva/sclera: Conjunctivae normal.     Pupils: Pupils are equal, round, and reactive to light.  Cardiovascular:     Rate and Rhythm: Normal rate and regular rhythm.  Pulmonary:     Effort: Pulmonary effort is normal. No respiratory distress.  Abdominal:     General: There is no distension.     Tenderness: There is no abdominal tenderness.  Skin:    General: Skin is warm and dry.  Neurological:     General: No focal deficit present.     Mental Status: She is alert. Mental status is at baseline.     GCS: GCS eye subscore is 4. GCS verbal subscore is 5. GCS motor subscore is 6.     Cranial Nerves:  No cranial nerve deficit, dysarthria or facial asymmetry.     Sensory: No sensory deficit.     Motor: No weakness.     Gait: Gait normal.     Comments: Reports paresthesias (diminished) sensation in left arm compared to right, and left foot compared to right  Psychiatric:        Mood and Affect: Mood normal.        Behavior: Behavior normal.     ED Results / Procedures / Treatments   Labs (all labs ordered are listed, but only abnormal results are displayed) Labs Reviewed  URINALYSIS, ROUTINE W REFLEX MICROSCOPIC - Abnormal; Notable for the following components:      Result Value   Nitrite POSITIVE (*)    Leukocytes,Ua TRACE (*)    Bacteria, UA MANY (*)    All other components within normal limits  BASIC METABOLIC PANEL - Abnormal; Notable for the following components:   Glucose, Bld 101 (*)    Creatinine, Ser 1.14 (*)    All other components within normal limits  URINE CULTURE  CBC WITH DIFFERENTIAL/PLATELET  MAGNESIUM  I-STAT BETA HCG BLOOD, ED ([facility], [facility], [facility] ONLY)    EKG EKG Interpretation  Date/Time:  Sunday January 03 2021 11:28:04 EDT Ventricular Rate:  74 PR Interval:  169 QRS Duration: 90 QT Interval:  374 QTC Calculation: 415 R Axis:   69 Text Interpretation: Sinus rhythm ST  elev, probable normal early repol pattern Confirmed by Octaviano Glow [phone]) on 01/03/2021 12:05:06 PM   Radiology CT Head Wo Contrast  Result Date: 01/03/2021 CLINICAL DATA:  Neuro deficit, acute stroke suspected. Left-sided facial paresthesia, left arm and left leg paresthesia. EXAM: CT HEAD WITHOUT CONTRAST TECHNIQUE: Contiguous axial images were obtained from the base of the skull through the vertex without intravenous contrast. COMPARISON:  None. FINDINGS: Brain: No evidence of acute large vascular territory infarction, hemorrhage, hydrocephalus, extra-axial collection or mass lesion/mass effect. Partially empty and expanded sella. Vascular: No hyperdense vessel identified. Skull: No acute fracture. Sinuses/Orbits: Minimal mucosal thickening of the right frontal sinus. Otherwise, clear visualized sinuses. Unremarkable orbits. Other: No mastoid effusions. IMPRESSION: 1. No evidence of acute intracranial abnormality. MRI could provide more sensitive evaluation for acute infarct if clinically indicated. 2. Partially empty and expanded sella. This finding is nonspecific and may be incidental, but can be seen with idiopathic intracranial hypertension in the correct clinical setting. Electronically Signed   By: Margaretha Sheffield MD   On: 01/03/2021 12:03   MR BRAIN WO CONTRAST  Result Date: 01/03/2021 CLINICAL DATA:  TIA. Left-sided paresthesias headaches. Question infarct versus MS. EXAM: MRI HEAD WITHOUT CONTRAST TECHNIQUE: Multiplanar, multiecho pulse sequences of the brain and surrounding structures were obtained without intravenous contrast. COMPARISON:  MRI cervical spine 01/03/2021. CT head without contrast 01/03/2021. FINDINGS: Brain: No acute infarct, hemorrhage, or mass lesion is present. The ventricles are of normal size. No significant white matter lesions are present. No significant extraaxial fluid collection is present. The internal auditory canals are within normal limits. The brainstem and  cerebellum are within normal limits. Enlarged empty sella confirmed.  CSF spaces otherwise unremarkable. Vascular: Flow is present in the major intracranial arteries. Skull and upper cervical spine: The craniocervical junction is normal. Upper cervical spine is within normal limits. Marrow signal is unremarkable. Sinuses/Orbits: The paranasal sinuses and mastoid air cells are clear. The globes and orbits are within normal limits. IMPRESSION: 1. Normal MRI appearance of the brain. No acute or focal lesion to explain the patient's  symptoms. 2. Enlarged empty sella. Although other signs of idiopathic intracranial hypertension are present, lumbar puncture could be used for diagnosis if no other etiology of headache is identified. Electronically Signed   By: San Morelle M.D.   On: 01/03/2021 14:46   MR Cervical Spine Wo Contrast  Result Date: 01/03/2021 CLINICAL DATA:  Multiple sclerosis.  New left-sided paresthesias. EXAM: MRI CERVICAL SPINE WITHOUT CONTRAST TECHNIQUE: Multiplanar, multisequence MR imaging of the cervical spine was performed. No intravenous contrast was administered. COMPARISON:  None. FINDINGS: Alignment: Physiologic. Vertebrae: No acute or suspicious osseous findings. Cord: Normal in signal and caliber. No demyelinating lesions are identified. Posterior Fossa, vertebral arteries, paraspinal tissues: Intracranial findings are dictated separately. Empty sella turcica noted. No significant paraspinal findings. Bilateral vertebral artery flow voids. Disc levels: Mild disc bulging and small central disc protrusions at C3-4, C4-5 and C5-6. The spinal canal is widely patent. The neural foramina are patent. There is no nerve root encroachment. IMPRESSION: 1. No acute findings or evidence of demyelinating lesion in the cervical cord. 2. Mild disc bulging with small disc protrusions. No spinal stenosis or nerve root encroachment. Electronically Signed   By: Richardean Sale M.D.   On: 01/03/2021  13:59    Procedures Procedures   Medications Ordered in ED Medications  midazolam (VERSED) injection 2 mg (2 mg Intravenous Given 01/03/21 1235)  cephALEXin (KEFLEX) capsule 500 mg (500 mg Oral Given 01/03/21 1531)    ED Course  I have reviewed the triage vital signs and the nursing notes.  Pertinent labs & imaging results that were available during my care of the patient were reviewed by me and considered in my medical decision making (see chart for details).  This patient complains of headache, left sided paresthesias  This involves an extensive number of treatment options, and is a complaint that carries with it a high risk of complications and morbidity.  The differential diagnosis includes CVA vs new onset MS vs complex migraine vs electrolyte deficiency vs other  I ordered, reviewed, and interpreted labs, showing UA with nitrites, leuks,  Mag normal, K 5.0, Cr 1.14, CBC normal I ordered imaging studies which included CT head, MRI brain and C spine I independently visualized and interpreted imaging which showed no life-threatening abnormalities, and the monitor tracing which showed NSR  With this workup complete, this may be a complex migraine, or else related to mild IIH- advised neuro clinic follow up.  Also urology f/u for her recurring UTI's.   Clinical Course as of 01/03/21 1701  Sun Jan 03, 2021  1305 No acute infarct on CT.  I spoke to Dr Lorrin Goodell from neurology who had recommended MR brain and C spine without contrast to evaluate for stroke or MS flare.  Labs otherwise unremarkable, some minor increase in Creatinine level. [MT]  1450 No acute infarct or lesions noted on MRI brain.  I have a lower suspicion for pseudotumor cerebrii as her headache is very minor, has no visual changes, and these symptoms seem quite atypical. [MT]  1518 I spoke to neurology Dr Lorrin Goodell who recommended outpatient neuro f/u.  Discussed UA with patient.  She does report that her urine has been  having a stronger odor recently.  She says she gets multiple UTIs per year, and finished a course of Macrobid last month.  I would advise that she see a urologist, as may be something structural going on causing these recurrent UTIs.  We will start her on Keflex for now.  Send urine  culture. [MT]    Clinical Course User Index [MT] Emberli Ballester, Carola Rhine, MD    Final Clinical Impression(s) / ED Diagnoses Final diagnoses:  Paresthesia  Urinary tract infection without hematuria, site unspecified  Nonintractable headache, unspecified chronicity pattern, unspecified headache type    Rx / DC Orders ED Discharge Orders         Ordered    Ambulatory referral to Neurology       Comments: An appointment is requested in approximately: 1 week Left sided paresthesias, headache, seen in [facility] ED - question of idiopathic intracranial HTN on MRI image - recommended for office follow up by neurologist in ED   01/03/21 1520    cephALEXin (KEFLEX) 500 MG capsule  2 times daily        01/03/21 1521           Wyvonnia Dusky, MD 01/03/21 1702

## 2021-01-03 NOTE — ED Notes (Signed)
Pt transported to MRI via wheelchair.

## 2021-01-03 NOTE — Discharge Instructions (Addendum)
Summary Discharge Recommendations:  Your primary care provider should recheck your Creatinine (kidney function) and potassium level in 2-3 weeks.  Your potassium was 5.0, which is on the high side of normal.   You should schedule follow-up appointment with urology for your recurring urine infections.  Please call the number for urology tomorrow morning and make an appointment with them.  You will need to take antibiotics for 5 days for this infection.    You will also need to follow-up with neurology for your left-sided tingling.  I placed a referral with their office - they should contact you in 1-2 days.  If you don't hear from them, call the number above to make an appointment.    We talked about the possibility that you may have higher pressure in your spinal fluid.  I included information to read about this.  I did NOT diagnosis you with this condition in the ER.  It requires further workup as an outpatient, starting with seeing a neurologist.  You should come back to the ER if you have sudden, severe headache, difficulty speaking, facial droop, weakness of your arms or legs, difficulty walking, loss of vision, or any other unusual symptoms that are concerning.

## 2021-01-04 ENCOUNTER — Encounter: Payer: Self-pay | Admitting: Neurology

## 2021-01-08 DIAGNOSIS — Z1231 Encounter for screening mammogram for malignant neoplasm of breast: Secondary | ICD-10-CM | POA: Diagnosis not present

## 2021-01-15 LAB — SUSCEPTIBILITY RESULT

## 2021-01-15 LAB — SUSCEPTIBILITY, AER + ANAEROB

## 2021-01-16 LAB — URINE CULTURE: Culture: >=100000 — AB

## 2021-01-20 NOTE — Progress Notes (Signed)
[facility] Clinic Note - Initial Visit   Date: 01/21/21  Ambrie Carte MRN: [id number] DOB: 21-Feb-1976   Dear Dr. Langston Masker:  Thank you for your kind referral of [name] for consultation of headaches. Although her history is well known to you, please allow [place] to reiterate it for the purpose of our medical record. The patient was accompanied to the clinic by self.    History of Present Illness: Virginia Olson is a 45 y.o. right-handed female presenting for evaluation of headaches and left sided numbness.  On 4/3, she was at her spin class and developed sudden onset of left face, arm, and leg numbness which lasted about 10 seconds. Symptoms did not recur.  She went to the ER where MRI brain did not show acute stroke.  MRI cervical spine also unremarkable.  She was told to follow-up with neurology.  She has not had any similar spells.  She endorses being well-hydrated during her exercise class.  She complains of numbness/tingling over the left 4th and 5th finger which has been intermittent since this time. No weakness of the arm.   Over the past 4 years, she has daily bifrontal and retroorbital pain described as dull. Headache typically lasts all day, but if she takes Goody's powder, pain is alleviated within an hour.  She takes [place] powder twice daily for the past 4 years.  No nausea, vomiting, photophobia, phonophobia.   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 No personal or family history of migraines.   Out-side paper records, electronic medical record, and images have been reviewed where available and summarized as:  MRI cervical spine 01/03/2021: 1. No acute findings or evidence of demyelinating lesion in the cervical cord. 2. Mild disc bulging with small disc protrusions. No spinal stenosis or nerve root encroachment.  MRI brain 01/03/2021: 1. Normal MRI appearance of the brain. No acute or focal lesion to explain the patient's  symptoms. 2. Enlarged empty sella. Although other signs of idiopathic intracranial hypertension are present, lumbar puncture could be used for diagnosis if no other etiology of headache is identified.  Lab Results  Component Value Date   CREATININE 1.14 (H) 01/03/2021   BUN 18 01/03/2021   NA 135 01/03/2021   K 5.0 01/03/2021   CL 102 01/03/2021   CO2 27 01/03/2021    Lab Results  Component Value Date   TSH 0.68 09/15/2014   No results found for: ESRSEDRATE, POCTSEDRATE  Past Medical History:  Diagnosis Date  . Anemia   . Asthma    bronchitis    . Endometriosis   . Fibroids 12/12/2014    Past Surgical History:  Procedure Laterality Date  . ABDOMINAL HYSTERECTOMY    . BREAST LUMPECTOMY WITH RADIOACTIVE SEED LOCALIZATION Left 12/27/2017   Procedure: BREAST LUMPECTOMY WITH RADIOACTIVE SEED LOCALIZATION;  Surgeon: Coralie Keens, MD;  Location: [facility];  Service: General;  Laterality: Left;  . IUD REMOVAL N/A 04/15/2015   Procedure: INTRAUTERINE DEVICE (IUD) REMOVAL;  Surgeon: Christophe Louis, MD;  Location: [facility];  Service: Gynecology;  Laterality: N/A;  . laproscopy    . ROBOTIC ASSISTED TOTAL HYSTERECTOMY WITH BILATERAL SALPINGO OOPHERECTOMY Right 04/15/2015   Procedure: ROBOTIC ASSISTED TOTAL HYSTERECTOMY WITH BILATERAL SALPINGOECTOMY and RIGHT OOOPHORECTOMY;  Surgeon: Christophe Louis, MD;  Location: [facility];  Service: Gynecology;  Laterality: Right;  . TUBAL LIGATION       Medications:  Outpatient Encounter Medications as of 01/21/2021  Medication Sig  . acetaminophen (TYLENOL) 325  MG tablet Take 650 mg by mouth every 6 (six) hours as needed for mild pain, moderate pain or headache.  . albuterol (PROVENTIL HFA;VENTOLIN HFA) 108 (90 BASE) MCG/ACT inhaler Inhale 2 puffs into the lungs every 6 (six) hours as needed for wheezing or shortness of breath.   . budesonide-formoterol (SYMBICORT) 80-4.5 MCG/ACT inhaler Inhale 2 puffs into the lungs 2 (two) times daily as needed for shortness of  breath.  . CRANBERRY PO Take 1 tablet by mouth daily.  Marland Kitchen FIBER PO Take 1 tablet by mouth daily.  . metFORMIN (GLUCOPHAGE) 500 MG tablet Take 1 tablet by mouth 2 (two) times daily with a meal.  . oxyCODONE (OXY IR/ROXICODONE) 5 MG immediate release tablet Take 1-2 tablets (5-10 mg total) by mouth every 6 (six) hours as needed for moderate pain or severe pain.  . Probiotic Product (PROBIOTIC PO) Take 1 capsule by mouth daily.  . S-Adenosylmethionine-B6-B12-FA (MOOD PLUS STRESS RELIEF PO) Take 1 tablet by mouth daily.  Marland Kitchen spironolactone (ALDACTONE) 100 MG tablet Take 100 mg by mouth 2 (two) times daily.  Marland Kitchen topiramate (TOPAMAX) 25 MG tablet Take 1 tablet (25 mg total) by mouth daily.  . valACYclovir (VALTREX) 1000 MG tablet Take 1 tablet by mouth 2 (two) times daily as needed (cold sore).   No facility-administered encounter medications on file as of 01/21/2021.    Allergies: No Known Allergies  Family History: Family History  Problem Relation Age of Onset  . Diabetes Father   . Hypertension Father   . Anemia Mother   . Thyroid disease Neg Hx     Social History: Social History   Tobacco Use  . Smoking status: Never Smoker  . Smokeless tobacco: Never Used  Vaping Use  . Vaping Use: Never used  Substance Use Topics  . Alcohol use: Yes    Alcohol/week: 0.0 standard drinks    Comment: rarely  . Drug use: No   Social History   Social History Narrative   Right handed    Works as Designer, jewellery    Vital Signs:  BP 117/70   Pulse 80   Ht 5\' 10"  (1.778 m)   Wt 238 lb 9.6 oz (108.2 kg)   LMP 03/16/2015 (Approximate)   SpO2 98%   BMI 34.24 kg/m   Neurological Exam: MENTAL STATUS including orientation to time, place, person, recent and remote memory, attention span and concentration, language, and fund of knowledge is normal.  Speech is not dysarthric.  CRANIAL NERVES: II:  No visual field defects.   III-IV-VI: Pupils equal round and reactive to light.  Normal conjugate,  extra-ocular eye movements in all directions of gaze.  No nystagmus.  No ptosis.   V:  Normal facial sensation.    VII:  Normal facial symmetry and movements.   VIII:  Normal hearing and vestibular function.   IX-X:  Normal palatal movement.   XI:  Normal shoulder shrug and head rotation.   XII:  Normal tongue strength and range of motion, no deviation or fasciculation.  MOTOR:  No atrophy, fasciculations or abnormal movements.  No pronator drift.   Upper Extremity:  Right  Left  Deltoid  5/5   5/5   Biceps  5/5   5/5   Triceps  5/5   5/5   Infraspinatus 5/5  5/5  Medial pectoralis 5/5  5/5  Wrist extensors  5/5   5/5   Wrist flexors  5/5   5/5   Finger extensors  5/5  5/5   Finger flexors  5/5   5/5   Dorsal interossei  5/5   5/5   Abductor pollicis  5/5   5/5   Tone (Ashworth scale)  0  0   Lower Extremity:  Right  Left  Hip flexors  5/5   5/5   Hip extensors  5/5   5/5   Adductor 5/5  5/5  Abductor 5/5  5/5  Knee flexors  5/5   5/5   Knee extensors  5/5   5/5   Dorsiflexors  5/5   5/5   Plantarflexors  5/5   5/5   Toe extensors  5/5   5/5   Toe flexors  5/5   5/5   Tone (Ashworth scale)  0  0   MSRs:  Right        Left                  brachioradialis 2+  2+  biceps 2+  2+  triceps 2+  2+  patellar 2+  2+  ankle jerk 2+  2+  Hoffman no  no  plantar response down  down   SENSORY:  Normal and symmetric perception of light touch, pinprick, vibration, and proprioception.  Romberg's sign absent.   COORDINATION/GAIT: Normal finger-to- nose-finger and heel-to-shin.  Intact rapid alternating movements bilaterally.  Able to rise from a chair without using arms.  Gait narrow based and stable. Tandem and stressed gait intact.    IMPRESSION: 1.  Medication overuse headaches  - wean off Goody's Powder 2.  Chronic tension headaches  - start topiramate 25mg  daily. Side effects discussed  - Call with update in 1 month, titrate as needed 3.  Left hand paresthesias -  ?ulnar neuropathy  - NCS/EMG of the left arm 4.  Episode of left hemisensory loss.  Unclear etiology. No evidence of stroke or cervical radiculopathy  - Exam is normal  - Reassurance provided  - If symptoms recur, vessel imaging will be obtained  Return to clinic in 3 months  Thank you for allowing me to participate in patient's care.  If I can answer any additional questions, I would be pleased to do so.    Sincerely,    Taegan Standage K. Posey Pronto, DO

## 2021-01-21 ENCOUNTER — Ambulatory Visit (INDEPENDENT_AMBULATORY_CARE_PROVIDER_SITE_OTHER): Payer: BC Managed Care – PPO | Admitting: Neurology

## 2021-01-21 ENCOUNTER — Encounter: Payer: Self-pay | Admitting: Neurology

## 2021-01-21 ENCOUNTER — Other Ambulatory Visit: Payer: Self-pay

## 2021-01-21 VITALS — BP 117/70 | HR 80 | Ht 70.0 in | Wt 238.6 lb

## 2021-01-21 DIAGNOSIS — G4440 Drug-induced headache, not elsewhere classified, not intractable: Secondary | ICD-10-CM

## 2021-01-21 DIAGNOSIS — G44229 Chronic tension-type headache, not intractable: Secondary | ICD-10-CM | POA: Diagnosis not present

## 2021-01-21 DIAGNOSIS — R202 Paresthesia of skin: Secondary | ICD-10-CM

## 2021-01-21 MED ORDER — TOPIRAMATE 25 MG PO TABS: 25.0000 mg | ORAL_TABLET | Freq: Every day | ORAL | 5 refills | Status: AC

## 2021-01-21 NOTE — Patient Instructions (Addendum)
Start topiramate 25mg  daily  Wean off Goody's powder.  Limit to twice per week.   Nerve testing of the left arm.  Do not apply any lotion or moisturizer on your left arm.  Send a MyChart message with update in 1 month  Return to clinic in 3 months  [place] (EMG/NCS) INSTRUCTIONS  How to Prepare The neurologist conducting the EMG will need to know if you have certain medical conditions. Tell the neurologist and other EMG lab personnel if you: . Have a pacemaker or any other electrical medical device . Take blood-thinning medications . Have hemophilia, a blood-clotting disorder that causes prolonged bleeding Bathing Take a shower or bath shortly before your exam in order to remove oils from your skin. Don't apply lotions or creams before the exam.  What to Expect You'll likely be asked to change into a hospital gown for the procedure and lie down on an examination table. The following explanations can help you understand what will happen during the exam.  . Electrodes. The neurologist or a technician places surface electrodes at various locations on your skin depending on where you're experiencing symptoms. Or the neurologist may insert needle electrodes at different sites depending on your symptoms.  . Sensations. The electrodes will at times transmit a tiny electrical current that you may feel as a twinge or spasm. The needle electrode may cause discomfort or pain that usually ends shortly after the needle is removed. If you are concerned about discomfort or pain, you may want to talk to the neurologist about taking a short break during the exam.  . Instructions. During the needle EMG, the neurologist will assess whether there is any spontaneous electrical activity when the muscle is at rest - activity that isn't present in healthy muscle tissue - and the degree of activity when you slightly contract the muscle.  He or she will give you instructions on  resting and contracting a muscle at appropriate times. Depending on what muscles and nerves the neurologist is examining, he or she may ask you to change positions during the exam.  After your EMG You may experience some temporary, minor bruising where the needle electrode was inserted into your muscle. This bruising should fade within several days. If it persists, contact your primary care doctor.

## 2021-01-28 DIAGNOSIS — J00 Acute nasopharyngitis [common cold]: Secondary | ICD-10-CM | POA: Diagnosis not present

## 2021-01-28 DIAGNOSIS — Z03818 Encounter for observation for suspected exposure to other biological agents ruled out: Secondary | ICD-10-CM | POA: Diagnosis not present

## 2021-01-28 DIAGNOSIS — Z20822 Contact with and (suspected) exposure to covid-19: Secondary | ICD-10-CM | POA: Diagnosis not present

## 2021-01-29 ENCOUNTER — Other Ambulatory Visit: Payer: Self-pay | Admitting: Family Medicine

## 2021-01-29 DIAGNOSIS — Z1231 Encounter for screening mammogram for malignant neoplasm of breast: Secondary | ICD-10-CM

## 2021-03-12 DIAGNOSIS — R8290 Unspecified abnormal findings in urine: Secondary | ICD-10-CM | POA: Diagnosis not present

## 2021-03-17 ENCOUNTER — Encounter: Payer: BC Managed Care – PPO | Admitting: Neurology

## 2021-03-24 DIAGNOSIS — M1711 Unilateral primary osteoarthritis, right knee: Secondary | ICD-10-CM | POA: Diagnosis not present

## 2021-03-24 DIAGNOSIS — M1712 Unilateral primary osteoarthritis, left knee: Secondary | ICD-10-CM | POA: Diagnosis not present

## 2021-05-13 ENCOUNTER — Ambulatory Visit: Payer: BC Managed Care – PPO | Admitting: Neurology

## 2021-05-27 DIAGNOSIS — R1032 Left lower quadrant pain: Secondary | ICD-10-CM | POA: Diagnosis not present

## 2021-05-27 DIAGNOSIS — N9489 Other specified conditions associated with female genital organs and menstrual cycle: Secondary | ICD-10-CM | POA: Diagnosis not present

## 2021-05-27 DIAGNOSIS — Z113 Encounter for screening for infections with a predominantly sexual mode of transmission: Secondary | ICD-10-CM | POA: Diagnosis not present

## 2021-05-28 DIAGNOSIS — R102 Pelvic and perineal pain: Secondary | ICD-10-CM | POA: Diagnosis not present

## 2021-05-28 DIAGNOSIS — Z8742 Personal history of other diseases of the female genital tract: Secondary | ICD-10-CM | POA: Diagnosis not present

## 2021-06-01 DIAGNOSIS — R102 Pelvic and perineal pain: Secondary | ICD-10-CM | POA: Diagnosis not present

## 2021-07-07 DIAGNOSIS — M25562 Pain in left knee: Secondary | ICD-10-CM | POA: Diagnosis not present

## 2021-07-07 DIAGNOSIS — M1711 Unilateral primary osteoarthritis, right knee: Secondary | ICD-10-CM | POA: Diagnosis not present

## 2021-07-07 DIAGNOSIS — M1712 Unilateral primary osteoarthritis, left knee: Secondary | ICD-10-CM | POA: Diagnosis not present

## 2021-07-07 DIAGNOSIS — M170 Bilateral primary osteoarthritis of knee: Secondary | ICD-10-CM | POA: Diagnosis not present

## 2021-07-07 DIAGNOSIS — M25561 Pain in right knee: Secondary | ICD-10-CM | POA: Diagnosis not present

## 2021-07-07 DIAGNOSIS — N390 Urinary tract infection, site not specified: Secondary | ICD-10-CM | POA: Diagnosis not present

## 2021-07-29 DIAGNOSIS — R102 Pelvic and perineal pain: Secondary | ICD-10-CM | POA: Diagnosis not present

## 2021-08-02 DIAGNOSIS — M1711 Unilateral primary osteoarthritis, right knee: Secondary | ICD-10-CM | POA: Diagnosis not present

## 2021-08-02 DIAGNOSIS — M1712 Unilateral primary osteoarthritis, left knee: Secondary | ICD-10-CM | POA: Diagnosis not present

## 2021-08-02 DIAGNOSIS — M170 Bilateral primary osteoarthritis of knee: Secondary | ICD-10-CM | POA: Diagnosis not present

## 2021-08-05 DIAGNOSIS — R1032 Left lower quadrant pain: Secondary | ICD-10-CM | POA: Diagnosis not present

## 2021-08-05 DIAGNOSIS — K5900 Constipation, unspecified: Secondary | ICD-10-CM | POA: Diagnosis not present

## 2021-08-13 DIAGNOSIS — R1032 Left lower quadrant pain: Secondary | ICD-10-CM | POA: Diagnosis not present

## 2021-08-16 DIAGNOSIS — M170 Bilateral primary osteoarthritis of knee: Secondary | ICD-10-CM | POA: Diagnosis not present

## 2021-08-16 DIAGNOSIS — M1711 Unilateral primary osteoarthritis, right knee: Secondary | ICD-10-CM | POA: Diagnosis not present

## 2021-08-16 DIAGNOSIS — M1712 Unilateral primary osteoarthritis, left knee: Secondary | ICD-10-CM | POA: Diagnosis not present

## 2021-08-23 DIAGNOSIS — M1711 Unilateral primary osteoarthritis, right knee: Secondary | ICD-10-CM | POA: Diagnosis not present

## 2021-08-23 DIAGNOSIS — M25461 Effusion, right knee: Secondary | ICD-10-CM | POA: Diagnosis not present

## 2021-08-23 DIAGNOSIS — M170 Bilateral primary osteoarthritis of knee: Secondary | ICD-10-CM | POA: Diagnosis not present

## 2021-08-23 DIAGNOSIS — M1712 Unilateral primary osteoarthritis, left knee: Secondary | ICD-10-CM | POA: Diagnosis not present

## 2021-09-14 DIAGNOSIS — M170 Bilateral primary osteoarthritis of knee: Secondary | ICD-10-CM | POA: Diagnosis not present

## 2021-09-14 DIAGNOSIS — M25461 Effusion, right knee: Secondary | ICD-10-CM | POA: Diagnosis not present

## 2021-09-14 DIAGNOSIS — M1711 Unilateral primary osteoarthritis, right knee: Secondary | ICD-10-CM | POA: Diagnosis not present

## 2021-09-14 DIAGNOSIS — M1712 Unilateral primary osteoarthritis, left knee: Secondary | ICD-10-CM | POA: Diagnosis not present

## 2021-09-19 DIAGNOSIS — N898 Other specified noninflammatory disorders of vagina: Secondary | ICD-10-CM | POA: Diagnosis not present

## 2021-09-19 DIAGNOSIS — N9489 Other specified conditions associated with female genital organs and menstrual cycle: Secondary | ICD-10-CM | POA: Diagnosis not present

## 2021-09-30 DIAGNOSIS — M25561 Pain in right knee: Secondary | ICD-10-CM | POA: Diagnosis not present

## 2021-10-01 DIAGNOSIS — L609 Nail disorder, unspecified: Secondary | ICD-10-CM | POA: Diagnosis not present

## 2021-10-11 DIAGNOSIS — J45909 Unspecified asthma, uncomplicated: Secondary | ICD-10-CM | POA: Insufficient documentation

## 2021-10-11 DIAGNOSIS — R7301 Impaired fasting glucose: Secondary | ICD-10-CM | POA: Insufficient documentation

## 2021-10-11 DIAGNOSIS — R1032 Left lower quadrant pain: Secondary | ICD-10-CM | POA: Diagnosis not present

## 2021-10-11 DIAGNOSIS — G5600 Carpal tunnel syndrome, unspecified upper limb: Secondary | ICD-10-CM | POA: Insufficient documentation

## 2021-10-11 DIAGNOSIS — K5900 Constipation, unspecified: Secondary | ICD-10-CM | POA: Diagnosis not present

## 2021-10-14 DIAGNOSIS — K648 Other hemorrhoids: Secondary | ICD-10-CM | POA: Diagnosis not present

## 2021-10-14 DIAGNOSIS — K5900 Constipation, unspecified: Secondary | ICD-10-CM | POA: Diagnosis not present

## 2021-10-14 DIAGNOSIS — R1032 Left lower quadrant pain: Secondary | ICD-10-CM | POA: Diagnosis not present

## 2021-10-19 DIAGNOSIS — Z6839 Body mass index (BMI) 39.0-39.9, adult: Secondary | ICD-10-CM | POA: Diagnosis not present

## 2021-10-19 DIAGNOSIS — N390 Urinary tract infection, site not specified: Secondary | ICD-10-CM | POA: Diagnosis not present

## 2021-10-19 DIAGNOSIS — Z0000 Encounter for general adult medical examination without abnormal findings: Secondary | ICD-10-CM | POA: Diagnosis not present

## 2021-10-19 DIAGNOSIS — R7301 Impaired fasting glucose: Secondary | ICD-10-CM | POA: Diagnosis not present

## 2021-10-19 DIAGNOSIS — Z13220 Encounter for screening for lipoid disorders: Secondary | ICD-10-CM | POA: Diagnosis not present

## 2021-10-19 DIAGNOSIS — D649 Anemia, unspecified: Secondary | ICD-10-CM | POA: Diagnosis not present

## 2021-11-02 DIAGNOSIS — M6281 Muscle weakness (generalized): Secondary | ICD-10-CM | POA: Diagnosis not present

## 2021-11-02 DIAGNOSIS — M170 Bilateral primary osteoarthritis of knee: Secondary | ICD-10-CM | POA: Diagnosis not present

## 2021-11-08 DIAGNOSIS — M170 Bilateral primary osteoarthritis of knee: Secondary | ICD-10-CM | POA: Diagnosis not present

## 2021-11-08 DIAGNOSIS — M6281 Muscle weakness (generalized): Secondary | ICD-10-CM | POA: Diagnosis not present

## 2021-11-22 DIAGNOSIS — M6281 Muscle weakness (generalized): Secondary | ICD-10-CM | POA: Diagnosis not present

## 2021-11-22 DIAGNOSIS — M170 Bilateral primary osteoarthritis of knee: Secondary | ICD-10-CM | POA: Diagnosis not present

## 2021-11-26 DIAGNOSIS — Z79899 Other long term (current) drug therapy: Secondary | ICD-10-CM | POA: Diagnosis not present

## 2021-11-26 DIAGNOSIS — Z411 Encounter for cosmetic surgery: Secondary | ICD-10-CM | POA: Diagnosis not present

## 2021-11-26 DIAGNOSIS — L720 Epidermal cyst: Secondary | ICD-10-CM | POA: Diagnosis not present

## 2021-11-26 DIAGNOSIS — L700 Acne vulgaris: Secondary | ICD-10-CM | POA: Diagnosis not present

## 2021-11-26 DIAGNOSIS — Z23 Encounter for immunization: Secondary | ICD-10-CM | POA: Diagnosis not present

## 2021-12-02 DIAGNOSIS — M170 Bilateral primary osteoarthritis of knee: Secondary | ICD-10-CM | POA: Diagnosis not present

## 2021-12-02 DIAGNOSIS — M6281 Muscle weakness (generalized): Secondary | ICD-10-CM | POA: Diagnosis not present

## 2021-12-17 DIAGNOSIS — M67912 Unspecified disorder of synovium and tendon, left shoulder: Secondary | ICD-10-CM | POA: Diagnosis not present

## 2022-01-03 DIAGNOSIS — M2042 Other hammer toe(s) (acquired), left foot: Secondary | ICD-10-CM | POA: Diagnosis not present

## 2022-01-03 DIAGNOSIS — M792 Neuralgia and neuritis, unspecified: Secondary | ICD-10-CM | POA: Diagnosis not present

## 2022-01-26 DIAGNOSIS — Z01818 Encounter for other preprocedural examination: Secondary | ICD-10-CM | POA: Diagnosis not present

## 2022-01-26 DIAGNOSIS — G8929 Other chronic pain: Secondary | ICD-10-CM | POA: Diagnosis not present

## 2022-02-03 DIAGNOSIS — M2040 Other hammer toe(s) (acquired), unspecified foot: Secondary | ICD-10-CM | POA: Diagnosis not present

## 2022-02-03 DIAGNOSIS — M2042 Other hammer toe(s) (acquired), left foot: Secondary | ICD-10-CM | POA: Diagnosis not present

## 2022-02-03 DIAGNOSIS — L859 Epidermal thickening, unspecified: Secondary | ICD-10-CM | POA: Diagnosis not present

## 2022-02-03 DIAGNOSIS — M24575 Contracture, left foot: Secondary | ICD-10-CM | POA: Diagnosis not present

## 2022-02-09 DIAGNOSIS — M2042 Other hammer toe(s) (acquired), left foot: Secondary | ICD-10-CM | POA: Diagnosis not present

## 2022-02-11 DIAGNOSIS — L658 Other specified nonscarring hair loss: Secondary | ICD-10-CM | POA: Diagnosis not present

## 2022-03-10 DIAGNOSIS — Z713 Dietary counseling and surveillance: Secondary | ICD-10-CM | POA: Diagnosis not present

## 2022-03-16 DIAGNOSIS — M2042 Other hammer toe(s) (acquired), left foot: Secondary | ICD-10-CM | POA: Diagnosis not present

## 2022-03-17 DIAGNOSIS — Z713 Dietary counseling and surveillance: Secondary | ICD-10-CM | POA: Diagnosis not present

## 2022-04-06 ENCOUNTER — Emergency Department (HOSPITAL_BASED_OUTPATIENT_CLINIC_OR_DEPARTMENT_OTHER): Payer: BC Managed Care – PPO

## 2022-04-06 ENCOUNTER — Encounter (HOSPITAL_BASED_OUTPATIENT_CLINIC_OR_DEPARTMENT_OTHER): Payer: Self-pay

## 2022-04-06 ENCOUNTER — Emergency Department (HOSPITAL_BASED_OUTPATIENT_CLINIC_OR_DEPARTMENT_OTHER)
Admission: EM | Admit: 2022-04-06 | Discharge: 2022-04-06 | Disposition: A | Discharge status: Home / Self Care | Payer: BC Managed Care – PPO | Attending: Emergency Medicine | Admitting: Emergency Medicine

## 2022-04-06 ENCOUNTER — Other Ambulatory Visit: Payer: Self-pay

## 2022-04-06 DIAGNOSIS — K5792 Diverticulitis of intestine, part unspecified, without perforation or abscess without bleeding: Secondary | ICD-10-CM | POA: Insufficient documentation

## 2022-04-06 DIAGNOSIS — R1030 Lower abdominal pain, unspecified: Secondary | ICD-10-CM | POA: Diagnosis not present

## 2022-04-06 DIAGNOSIS — R1110 Vomiting, unspecified: Secondary | ICD-10-CM | POA: Diagnosis not present

## 2022-04-06 DIAGNOSIS — R109 Unspecified abdominal pain: Secondary | ICD-10-CM | POA: Diagnosis not present

## 2022-04-06 LAB — COMPREHENSIVE METABOLIC PANEL
ALT: 12 U/L (ref 0–44)
AST: 24 U/L (ref 15–41)
Albumin: 4.5 g/dL (ref 3.5–5.0)
Alkaline Phosphatase: 98 U/L (ref 38–126)
Anion gap: 14 (ref 5–15)
BUN: 17 mg/dL (ref 6–20)
CO2: 22 mmol/L (ref 22–32)
Calcium: 10.2 mg/dL (ref 8.9–10.3)
Chloride: 102 mmol/L (ref 98–111)
Creatinine, Ser: 0.98 mg/dL (ref 0.44–1.00)
Glucose, Bld: 161 mg/dL — ABNORMAL HIGH (ref 70–99)
Potassium: 4 mmol/L (ref 3.5–5.1)
Sodium: 138 mmol/L (ref 135–145)
Total Bilirubin: 0.3 mg/dL (ref 0.3–1.2)
Total Protein: 8.1 g/dL (ref 6.5–8.1)

## 2022-04-06 LAB — CBC
HCT: 34.8 % — ABNORMAL LOW (ref 36.0–46.0)
Hemoglobin: 11.6 g/dL — ABNORMAL LOW (ref 12.0–15.0)
MCH: 29.3 pg (ref 26.0–34.0)
MCHC: 33.3 g/dL (ref 30.0–36.0)
MCV: 87.9 fL (ref 80.0–100.0)
Platelets: 293 10*3/uL (ref 150–400)
RBC: 3.96 MIL/uL (ref 3.87–5.11)
RDW: 13.2 % (ref 11.5–15.5)
WBC: 9.4 10*3/uL (ref 4.0–10.5)
nRBC: 0 % (ref 0.0–0.2)

## 2022-04-06 LAB — LIPASE, BLOOD: Lipase: 22 U/L (ref 11–51)

## 2022-04-06 LAB — LACTIC ACID, PLASMA: Lactic Acid, Venous: 2.4 mmol/L (ref 0.5–1.9)

## 2022-04-06 MED ORDER — METRONIDAZOLE 500 MG PO TABS: 500.0000 mg | ORAL_TABLET | Freq: Two times a day (BID) | ORAL | 0 refills | Status: AC

## 2022-04-06 MED ORDER — OXYCODONE-ACETAMINOPHEN 5-325 MG PO TABS
1.0000 | ORAL_TABLET | Freq: Four times a day (QID) | ORAL | 0 refills | Status: AC | PRN
Start: 2022-04-06 — End: ?

## 2022-04-06 MED ORDER — METRONIDAZOLE 500 MG/100ML IV SOLN
500.0000 mg | Freq: Two times a day (BID) | INTRAVENOUS | Status: DC
  Administered 2022-04-06: 500 mg via INTRAVENOUS
  Filled 2022-04-06: qty 100

## 2022-04-06 MED ORDER — LACTATED RINGERS IV BOLUS: 1000.0000 mL | Freq: Once | INTRAVENOUS | Status: DC

## 2022-04-06 MED ORDER — ONDANSETRON HCL 4 MG/2ML IJ SOLN
4.0000 mg | Freq: Once | INTRAMUSCULAR | Status: AC
  Administered 2022-04-06: 4 mg via INTRAVENOUS
  Filled 2022-04-06: qty 2

## 2022-04-06 MED ORDER — ONDANSETRON HCL 4 MG/2ML IJ SOLN
4.0000 mg | Freq: Once | INTRAMUSCULAR | Status: AC | PRN
  Administered 2022-04-06: 4 mg via INTRAVENOUS
  Filled 2022-04-06: qty 2

## 2022-04-06 MED ORDER — HYDROMORPHONE HCL 1 MG/ML IJ SOLN
1.0000 mg | Freq: Once | INTRAMUSCULAR | Status: AC
  Administered 2022-04-06: 1 mg via INTRAVENOUS
  Filled 2022-04-06: qty 1

## 2022-04-06 MED ORDER — IOHEXOL 300 MG/ML  SOLN
100.0000 mL | Freq: Once | INTRAMUSCULAR | Status: AC | PRN
  Administered 2022-04-06: 100 mL via INTRAVENOUS

## 2022-04-06 MED ORDER — OXYCODONE-ACETAMINOPHEN 5-325 MG PO TABS
1.0000 | ORAL_TABLET | Freq: Once | ORAL | Status: AC
  Administered 2022-04-06: 1 via ORAL
  Filled 2022-04-06: qty 1

## 2022-04-06 MED ORDER — AMOXICILLIN-POT CLAVULANATE 875-125 MG PO TABS: 1.0000 | ORAL_TABLET | Freq: Two times a day (BID) | ORAL | 0 refills | Status: AC

## 2022-04-06 MED ORDER — SODIUM CHLORIDE 0.9 % IV SOLN
2.0000 g | Freq: Once | INTRAVENOUS | Status: AC
  Administered 2022-04-06: 2 g via INTRAVENOUS
  Filled 2022-04-06: qty 20

## 2022-04-06 MED ORDER — ONDANSETRON 4 MG PO TBDP: 4.0000 mg | ORAL_TABLET | ORAL | 0 refills | Status: AC | PRN

## 2022-04-06 MED ORDER — HYDROMORPHONE HCL 1 MG/ML IJ SOLN
0.5000 mg | Freq: Once | INTRAMUSCULAR | Status: AC
  Administered 2022-04-06: 0.5 mg via INTRAVENOUS
  Filled 2022-04-06: qty 1

## 2022-04-06 NOTE — ED Triage Notes (Signed)
Pt arrives from home with spouse-- cc:  Lower abd pain with associated n/v and weakness x4 hours pta.  Pt suspects food poisoning from undercooked chicken.  Pt reportedly took nausea med prior to arrival with no relief.  (Note- when pt was attempting to xfer from waiting room seat to w/c she stumbled and went down to her knees -- did not hit head or fall completely to ground).

## 2022-04-06 NOTE — Discharge Instructions (Addendum)
1.  Start taking your antibiotics at this evening.  You may take 1-2 Percocet tablets every 6 hours as needed for pain.  You may take Zofran as prescribed if needed for nausea.  Try to stay well-hydrated.  Eat a very light, bland diet. 2.  Schedule follow-up with your family doctor within the next 2 to 3 days. 3.  Return to the emergency department immediately if your pain is suddenly worsening again, you get a fever, you cannot take your medications, you are vomiting or other concerning symptoms.

## 2022-04-06 NOTE — ED Notes (Addendum)
Pt cool and clammy in bed -- awaiting ED provider at this time; rocking back and forth in bed and dry heaving.

## 2022-04-06 NOTE — ED Provider Notes (Signed)
[facility] DEPT Provider Note   CSN: [id number] Arrival date & time: 04/06/22  0549     History  Chief Complaint  Patient presents with   Abdominal Pain    Virginia Olson is a 46 y.o. female.  HPI Patient went to bed well.  She awakened suddenly at about 2 AM with severe lower abdominal pain.  She indicates the suprapubic region.  Central without radiation.  Pain has been constant and severe.  Patient reports she feels nauseated.  She has been sweaty.  She reports she is unable to have a bowel movement no diarrhea.  Patient did not have any pain burning with urination leading up to symptoms.  She has not had any recent diarrheal illness.  Patient does report a history of diverticulitis.  She thought possibly food poisoning as well.  She reports she ate some chicken that might have been undercooked.    Home Medications Prior to Admission medications   Medication Sig Start Date End Date Taking? Authorizing Provider  amoxicillin-clavulanate (AUGMENTIN) 875-125 MG tablet Take 1 tablet by mouth every 12 (twelve) hours. 04/06/22  Yes Charlesetta Shanks, MD  metroNIDAZOLE (FLAGYL) 500 MG tablet Take 1 tablet (500 mg total) by mouth 2 (two) times daily. One po bid x 7 days 04/06/22  Yes Malachy Coleman, Jeannie Done, MD  ondansetron (ZOFRAN-ODT) 4 MG disintegrating tablet Take 1 tablet (4 mg total) by mouth every 4 (four) hours as needed for nausea or vomiting. 04/06/22  Yes Charlesetta Shanks, MD  oxyCODONE-acetaminophen (PERCOCET) 5-325 MG tablet Take 1-2 tablets by mouth every 6 (six) hours as needed. 04/06/22  Yes Charlesetta Shanks, MD  acetaminophen (TYLENOL) 325 MG tablet Take 650 mg by mouth every 6 (six) hours as needed for mild pain, moderate pain or headache.    [provider]  albuterol (PROVENTIL HFA;VENTOLIN HFA) 108 (90 BASE) MCG/ACT inhaler Inhale 2 puffs into the lungs every 6 (six) hours as needed for wheezing or shortness of breath.     [provider]   budesonide-formoterol (SYMBICORT) 80-4.5 MCG/ACT inhaler Inhale 2 puffs into the lungs 2 (two) times daily as needed for shortness of breath. 01/05/18   [provider]  CRANBERRY PO Take 1 tablet by mouth daily.    [provider]  FIBER PO Take 1 tablet by mouth daily.    [provider]  metFORMIN (GLUCOPHAGE) 500 MG tablet Take 1 tablet by mouth 2 (two) times daily with a meal.    [provider]  oxyCODONE (OXY IR/ROXICODONE) 5 MG immediate release tablet Take 1-2 tablets (5-10 mg total) by mouth every 6 (six) hours as needed for moderate pain or severe pain. 12/27/17   Coralie Keens, MD  Probiotic Product (PROBIOTIC PO) Take 1 capsule by mouth daily.    [provider]  S-Adenosylmethionine-B6-B12-FA (MOOD PLUS STRESS RELIEF PO) Take 1 tablet by mouth daily.    [provider]  spironolactone (ALDACTONE) 100 MG tablet Take 100 mg by mouth 2 (two) times daily. 12/25/20   [provider]  topiramate (TOPAMAX) 25 MG tablet Take 1 tablet (25 mg total) by mouth daily. 01/21/21   Patel, Arvin Collard K, DO  valACYclovir (VALTREX) 1000 MG tablet Take 1 tablet by mouth 2 (two) times daily as needed (cold sore). 07/10/17   [provider]      Allergies    Patient has no known allergies.    Review of Systems   Review of Systems 10 systems reviewed and negative except as per  HPI Physical Exam Updated Vital Signs BP 110/69 (BP Location: Left Arm)   Pulse 60   Temp 98.2 F (36.8 C) (Oral)   Resp 16   Ht '5\' 9"'$  (1.753 m)   Wt 101.2 kg   LMP 03/16/2015 (Approximate)   SpO2 100%   BMI 32.93 kg/m  Physical Exam Constitutional:      Comments: Patient is alert.  No respiratory distress. patient is in severe pain lying on her right side.  HENT:     Mouth/Throat:     Pharynx: Oropharynx is clear.  Eyes:     Extraocular Movements: Extraocular movements intact.  Cardiovascular:     Rate and Rhythm: Normal rate and regular rhythm.   Pulmonary:     Effort: Pulmonary effort is normal.     Breath sounds: Normal breath sounds.  Abdominal:     Comments: Abdomen very tender to palpation in lower abdomen.  No guarding upper abdomen.  Musculoskeletal:        General: Normal range of motion.     Right lower leg: No edema.     Left lower leg: No edema.  Skin:    Comments: Skin is diaphoretic  Neurological:     General: No focal deficit present.     Mental Status: She is oriented to person, place, and time.     Coordination: Coordination normal.     ED Results / Procedures / Treatments   Labs (all labs ordered are listed, but only abnormal results are displayed) Labs Reviewed  COMPREHENSIVE METABOLIC PANEL - Abnormal; Notable for the following components:      Result Value   Glucose, Bld 161 (*)    All other components within normal limits  CBC - Abnormal; Notable for the following components:   Hemoglobin 11.6 (*)    HCT 34.8 (*)    All other components within normal limits  LACTIC ACID, PLASMA - Abnormal; Notable for the following components:   Lactic Acid, Venous 2.4 (*)    All other components within normal limits  LIPASE, BLOOD  URINALYSIS, ROUTINE W REFLEX MICROSCOPIC  LACTIC ACID, PLASMA    EKG None  Radiology CT Abdomen Pelvis W Contrast  Result Date: 04/06/2022 CLINICAL DATA:  Lower abdominal pain with nausea vomiting and weakness x4 hours. EXAM: CT ABDOMEN AND PELVIS WITH CONTRAST TECHNIQUE: Multidetector CT imaging of the abdomen and pelvis was performed using the standard protocol following bolus administration of intravenous contrast. RADIATION DOSE REDUCTION: This exam was performed according to the departmental dose-optimization program which includes automated exposure control, adjustment of the mA and/or kV according to patient size and/or use of iterative reconstruction technique. CONTRAST:  141m OMNIPAQUE IOHEXOL 300 MG/ML  SOLN COMPARISON:  CT July 15, 2016 FINDINGS: Lower chest: Mild  hypoventilatory change in the dependent lungs. Mild distal esophageal wall thickening possibly reflecting sequela of reported emesis. Hepatobiliary: Subcentimeter hypodense hepatic lesions are technically too small to accurately characterize but stable dating back to July 15, 2016 consistent with a benign finding such as cysts or hemangiomas. Gallbladder is unremarkable. No biliary ductal dilation. Pancreas: No pancreatic ductal dilation or evidence of acute inflammation. Spleen: No splenomegaly or focal splenic lesion. Adrenals/Urinary Tract: Bilateral adrenal glands appear normal. No hydronephrosis. Urinary bladder is unremarkable for degree of distension. Stomach/Bowel: Stomach is unremarkable for degree of distension. No pathologic dilation of small or large bowel. The appendix and terminal ileum appear normal. Scattered colonic diverticula with an inflamed colonic diverticulum at the splenic flexure on image 33/2.  Vascular/Lymphatic: Normal caliber abdominal aorta. No pathologically enlarged abdominal or pelvic lymph nodes. Reproductive: Status post hysterectomy. No adnexal masses. Other: No significant abdominopelvic free fluid. Musculoskeletal: No acute osseous IMPRESSION: Acute uncomplicated colonic diverticulitis along the splenic flexure. Electronically Signed   By: Dahlia Bailiff M.D.   On: 04/06/2022 07:58    Procedures Procedures    Medications Ordered in ED Medications  lactated ringers bolus 1,000 mL (has no administration in time range)  metroNIDAZOLE (FLAGYL) IVPB 500 mg (500 mg Intravenous New Bag/Given 04/06/22 0921)  ondansetron (ZOFRAN) injection 4 mg (4 mg Intravenous Given 04/06/22 0629)  HYDROmorphone (DILAUDID) injection 1 mg (1 mg Intravenous Given 04/06/22 0714)  ondansetron (ZOFRAN) injection 4 mg (4 mg Intravenous Given 04/06/22 0714)  iohexol (OMNIPAQUE) 300 MG/ML solution 100 mL (100 mLs Intravenous Contrast Given 04/06/22 0732)  cefTRIAXone (ROCEPHIN) 2 g in sodium chloride 0.9  % 100 mL IVPB (0 g Intravenous Stopped 04/06/22 0919)  HYDROmorphone (DILAUDID) injection 0.5 mg (0.5 mg Intravenous Given 04/06/22 0928)  oxyCODONE-acetaminophen (PERCOCET/ROXICET) 5-325 MG per tablet 1 tablet (1 tablet Oral Given 04/06/22 1027)    ED Course/ Medical Decision Making/ A&P                           Medical Decision Making Amount and/or Complexity of Data Reviewed Labs: ordered. Radiology: ordered.  Risk Prescription drug management.   Patient presents with severe and acute onset of pain during the night.  Differential diagnosis includes diverticulitis\bowel obstruction\aortic aneurysm\GYN etiology.  We will proceed with pain control with Dilaudid and nausea control with Zofran.  The CT abdomen pelvis to rule out multiple potential life-threatening etiologies.  Patient recheck after Dilaudid and Zofran.  Patient much improved.  Now able to rest quietly.  Lab work reviewed with no significant abnormalities.  No significant leukocytosis or electrolyte derangement.  Patient is mildly anemic with hemoglobin 11.6.  Blood sugar 161 mild elevation.  I have personally reviewed CT images and did not note any significant obstruction or vascular anomaly.  Radiology review reports focal area of diverticulitis at the splenic flexure.  Otherwise unremarkable.  At this time with positive finding of inflamed diverticulum and severe pain, will treat with IV antibiotics.  Patient was vomiting and in pain.  Will give 1 dose of Rocephin and Flagyl.  After pain control and antibiotics will reassess to determine if patient will require admission for treatment or may be candidate for outpatient treatment if tolerating oral intake and pain control.  Patient has continued to look much improved.  Vital signs are stable.  Patient does not have leukocytosis.  No fever.  CT shows a limited area of diverticulitis.  At this time I have reviewed the plan with the patient and family members of resuming  antibiotics at home with pain control with Percocet.  Return precautions reviewed.  Patient is aware of the need to return if any worsening or evolving symptoms.  Discussed the range of diverticular disease from mild to severe requiring surgery.  Patient and her husband voiced understanding.        Final Clinical Impression(s) / ED Diagnoses Final diagnoses:  Diverticulitis    Rx / DC Orders ED Discharge Orders          Ordered    metroNIDAZOLE (FLAGYL) 500 MG tablet  2 times daily        04/06/22 1024    amoxicillin-clavulanate (AUGMENTIN) 875-125 MG tablet  Every 12 hours  04/06/22 1024    oxyCODONE-acetaminophen (PERCOCET) 5-325 MG tablet  Every 6 hours PRN        04/06/22 1024    ondansetron (ZOFRAN-ODT) 4 MG disintegrating tablet  Every 4 hours PRN        04/06/22 1026              Charlesetta Shanks, MD 04/06/22 1030

## 2022-04-07 DIAGNOSIS — M25562 Pain in left knee: Secondary | ICD-10-CM | POA: Diagnosis not present

## 2022-04-07 DIAGNOSIS — M170 Bilateral primary osteoarthritis of knee: Secondary | ICD-10-CM | POA: Diagnosis not present

## 2022-04-07 DIAGNOSIS — M1711 Unilateral primary osteoarthritis, right knee: Secondary | ICD-10-CM | POA: Diagnosis not present

## 2022-04-07 DIAGNOSIS — M1712 Unilateral primary osteoarthritis, left knee: Secondary | ICD-10-CM | POA: Diagnosis not present

## 2022-04-07 DIAGNOSIS — M25561 Pain in right knee: Secondary | ICD-10-CM | POA: Diagnosis not present

## 2022-04-12 ENCOUNTER — Other Ambulatory Visit: Payer: Self-pay

## 2022-04-12 ENCOUNTER — Encounter (HOSPITAL_BASED_OUTPATIENT_CLINIC_OR_DEPARTMENT_OTHER): Payer: Self-pay | Admitting: Obstetrics and Gynecology

## 2022-04-12 DIAGNOSIS — R0602 Shortness of breath: Secondary | ICD-10-CM | POA: Diagnosis not present

## 2022-04-12 DIAGNOSIS — Z5321 Procedure and treatment not carried out due to patient leaving prior to being seen by health care provider: Secondary | ICD-10-CM | POA: Insufficient documentation

## 2022-04-12 NOTE — ED Triage Notes (Signed)
Patient reports to the ER for shortness of breath. Patient reports she felt like she was "out of it". Patient reports she used narcan on herself after taking 1 Oxycodone and her antibiotics and metformin. Patient reports she felt better after the narcan but then began feeling like she was off again and she reports feeling out of breath. Patient reports she feels like this is not her normal breathing.

## 2022-04-13 ENCOUNTER — Emergency Department (HOSPITAL_BASED_OUTPATIENT_CLINIC_OR_DEPARTMENT_OTHER)
Admission: EM | Admit: 2022-04-13 | Discharge: 2022-04-13 | Discharge status: Against Medical Advice | Payer: BC Managed Care – PPO | Attending: Emergency Medicine | Admitting: Emergency Medicine

## 2022-04-27 DIAGNOSIS — M2042 Other hammer toe(s) (acquired), left foot: Secondary | ICD-10-CM | POA: Diagnosis not present

## 2022-04-27 DIAGNOSIS — M792 Neuralgia and neuritis, unspecified: Secondary | ICD-10-CM | POA: Diagnosis not present

## 2022-05-21 DIAGNOSIS — R21 Rash and other nonspecific skin eruption: Secondary | ICD-10-CM | POA: Diagnosis not present

## 2022-06-14 DIAGNOSIS — M25561 Pain in right knee: Secondary | ICD-10-CM | POA: Diagnosis not present

## 2022-06-14 DIAGNOSIS — M25461 Effusion, right knee: Secondary | ICD-10-CM | POA: Diagnosis not present

## 2022-06-29 DIAGNOSIS — M25461 Effusion, right knee: Secondary | ICD-10-CM | POA: Diagnosis not present

## 2022-06-29 DIAGNOSIS — M25561 Pain in right knee: Secondary | ICD-10-CM | POA: Diagnosis not present

## 2022-07-22 DIAGNOSIS — M1712 Unilateral primary osteoarthritis, left knee: Secondary | ICD-10-CM | POA: Diagnosis not present

## 2022-07-22 DIAGNOSIS — M170 Bilateral primary osteoarthritis of knee: Secondary | ICD-10-CM | POA: Diagnosis not present

## 2022-07-22 DIAGNOSIS — M1711 Unilateral primary osteoarthritis, right knee: Secondary | ICD-10-CM | POA: Diagnosis not present

## 2022-07-29 DIAGNOSIS — M1711 Unilateral primary osteoarthritis, right knee: Secondary | ICD-10-CM | POA: Diagnosis not present

## 2022-07-29 DIAGNOSIS — M1712 Unilateral primary osteoarthritis, left knee: Secondary | ICD-10-CM | POA: Diagnosis not present

## 2022-07-29 DIAGNOSIS — M170 Bilateral primary osteoarthritis of knee: Secondary | ICD-10-CM | POA: Diagnosis not present

## 2022-08-08 DIAGNOSIS — M1711 Unilateral primary osteoarthritis, right knee: Secondary | ICD-10-CM | POA: Diagnosis not present

## 2022-08-08 DIAGNOSIS — M1712 Unilateral primary osteoarthritis, left knee: Secondary | ICD-10-CM | POA: Diagnosis not present

## 2022-08-08 DIAGNOSIS — M170 Bilateral primary osteoarthritis of knee: Secondary | ICD-10-CM | POA: Diagnosis not present

## 2022-08-20 DIAGNOSIS — E669 Obesity, unspecified: Secondary | ICD-10-CM | POA: Diagnosis not present

## 2022-08-20 DIAGNOSIS — M150 Primary generalized (osteo)arthritis: Secondary | ICD-10-CM | POA: Diagnosis not present

## 2022-08-20 DIAGNOSIS — K5900 Constipation, unspecified: Secondary | ICD-10-CM | POA: Diagnosis not present

## 2022-08-20 DIAGNOSIS — R5383 Other fatigue: Secondary | ICD-10-CM | POA: Diagnosis not present

## 2022-08-24 DIAGNOSIS — E669 Obesity, unspecified: Secondary | ICD-10-CM | POA: Diagnosis not present

## 2022-08-24 DIAGNOSIS — R5383 Other fatigue: Secondary | ICD-10-CM | POA: Diagnosis not present

## 2022-08-24 DIAGNOSIS — K5900 Constipation, unspecified: Secondary | ICD-10-CM | POA: Diagnosis not present

## 2022-08-24 DIAGNOSIS — M150 Primary generalized (osteo)arthritis: Secondary | ICD-10-CM | POA: Diagnosis not present

## 2022-09-01 DIAGNOSIS — M150 Primary generalized (osteo)arthritis: Secondary | ICD-10-CM | POA: Diagnosis not present

## 2022-09-01 DIAGNOSIS — R5383 Other fatigue: Secondary | ICD-10-CM | POA: Diagnosis not present

## 2022-09-01 DIAGNOSIS — E669 Obesity, unspecified: Secondary | ICD-10-CM | POA: Diagnosis not present

## 2022-09-01 DIAGNOSIS — E785 Hyperlipidemia, unspecified: Secondary | ICD-10-CM | POA: Diagnosis not present

## 2022-10-25 ENCOUNTER — Other Ambulatory Visit: Payer: Self-pay | Admitting: Pain Medicine

## 2022-10-25 ENCOUNTER — Other Ambulatory Visit: Payer: Self-pay | Admitting: Family Medicine

## 2022-10-25 ENCOUNTER — Other Ambulatory Visit: Payer: Self-pay

## 2022-10-25 DIAGNOSIS — Z1231 Encounter for screening mammogram for malignant neoplasm of breast: Secondary | ICD-10-CM

## 2022-10-27 ENCOUNTER — Ambulatory Visit
Admission: RE | Admit: 2022-10-27 | Discharge: 2022-10-27 | Disposition: A | Discharge status: Home / Self Care | Payer: No Typology Code available for payment source | Source: Ambulatory Visit | Attending: Pain Medicine | Admitting: Pain Medicine

## 2022-10-27 DIAGNOSIS — Z1231 Encounter for screening mammogram for malignant neoplasm of breast: Secondary | ICD-10-CM

## 2022-10-27 HISTORY — DX: Nausea with vomiting, unspecified: R11.2

## 2023-06-15 DIAGNOSIS — L700 Acne vulgaris: Secondary | ICD-10-CM | POA: Insufficient documentation

## 2023-12-10 ENCOUNTER — Encounter (HOSPITAL_BASED_OUTPATIENT_CLINIC_OR_DEPARTMENT_OTHER): Payer: Self-pay | Admitting: *Deleted

## 2023-12-10 ENCOUNTER — Observation Stay (HOSPITAL_BASED_OUTPATIENT_CLINIC_OR_DEPARTMENT_OTHER)
Admission: EM | Admit: 2023-12-10 | Discharge: 2023-12-14 | Discharge status: Against Medical Advice | Payer: Self-pay | Attending: Student in an Organized Health Care Education/Training Program | Admitting: Student in an Organized Health Care Education/Training Program

## 2023-12-10 ENCOUNTER — Other Ambulatory Visit: Payer: Self-pay

## 2023-12-10 DIAGNOSIS — E872 Acidosis, unspecified: Secondary | ICD-10-CM | POA: Diagnosis not present

## 2023-12-10 DIAGNOSIS — R10817 Generalized abdominal tenderness: Principal | ICD-10-CM | POA: Insufficient documentation

## 2023-12-10 DIAGNOSIS — R1084 Generalized abdominal pain: Principal | ICD-10-CM

## 2023-12-10 DIAGNOSIS — R112 Nausea with vomiting, unspecified: Secondary | ICD-10-CM | POA: Diagnosis present

## 2023-12-10 DIAGNOSIS — J45909 Unspecified asthma, uncomplicated: Secondary | ICD-10-CM | POA: Diagnosis present

## 2023-12-10 DIAGNOSIS — Z5321 Procedure and treatment not carried out due to patient leaving prior to being seen by health care provider: Secondary | ICD-10-CM | POA: Insufficient documentation

## 2023-12-10 DIAGNOSIS — E66811 Obesity, class 1: Secondary | ICD-10-CM | POA: Diagnosis present

## 2023-12-10 DIAGNOSIS — R109 Unspecified abdominal pain: Secondary | ICD-10-CM | POA: Diagnosis present

## 2023-12-10 DIAGNOSIS — R9431 Abnormal electrocardiogram [ECG] [EKG]: Secondary | ICD-10-CM | POA: Diagnosis present

## 2023-12-10 DIAGNOSIS — Z9889 Other specified postprocedural states: Secondary | ICD-10-CM | POA: Diagnosis not present

## 2023-12-10 LAB — CBC WITH DIFFERENTIAL/PLATELET
Abs Immature Granulocytes: 0.03 10*3/uL (ref 0.00–0.07)
Basophils Absolute: 0.1 10*3/uL (ref 0.0–0.1)
Basophils Relative: 1 %
Eosinophils Absolute: 0 10*3/uL (ref 0.0–0.5)
Eosinophils Relative: 0 %
HCT: 33.4 % — ABNORMAL LOW (ref 36.0–46.0)
Hemoglobin: 11.5 g/dL — ABNORMAL LOW (ref 12.0–15.0)
Immature Granulocytes: 0 %
Lymphocytes Relative: 26 %
Lymphs Abs: 2.4 10*3/uL (ref 0.7–4.0)
MCH: 29.1 pg (ref 26.0–34.0)
MCHC: 34.4 g/dL (ref 30.0–36.0)
MCV: 84.6 fL (ref 80.0–100.0)
Monocytes Absolute: 0.6 10*3/uL (ref 0.1–1.0)
Monocytes Relative: 6 %
Neutro Abs: 6.2 10*3/uL (ref 1.7–7.7)
Neutrophils Relative %: 67 %
Platelets: 340 10*3/uL (ref 150–400)
RBC: 3.95 MIL/uL (ref 3.87–5.11)
RDW: 14 % (ref 11.5–15.5)
WBC: 9.2 10*3/uL (ref 4.0–10.5)
nRBC: 0 % (ref 0.0–0.2)

## 2023-12-10 LAB — LIPASE, BLOOD: Lipase: 43 U/L (ref 11–51)

## 2023-12-10 LAB — COMPREHENSIVE METABOLIC PANEL
ALT: 14 U/L (ref 0–44)
AST: 23 U/L (ref 15–41)
Albumin: 4.2 g/dL (ref 3.5–5.0)
Alkaline Phosphatase: 87 U/L (ref 38–126)
Anion gap: 13 (ref 5–15)
BUN: 13 mg/dL (ref 6–20)
CO2: 21 mmol/L — ABNORMAL LOW (ref 22–32)
Calcium: 9.9 mg/dL (ref 8.9–10.3)
Chloride: 100 mmol/L (ref 98–111)
Creatinine, Ser: 1.06 mg/dL — ABNORMAL HIGH (ref 0.44–1.00)
GFR, Estimated: >60 mL/min (ref >60)
Glucose, Bld: 164 mg/dL — ABNORMAL HIGH (ref 70–99)
Potassium: 3.2 mmol/L — ABNORMAL LOW (ref 3.5–5.1)
Sodium: 134 mmol/L — ABNORMAL LOW (ref 135–145)
Total Bilirubin: 0.4 mg/dL (ref 0.0–1.2)
Total Protein: 7.3 g/dL (ref 6.5–8.1)

## 2023-12-10 MED ORDER — ONDANSETRON HCL 4 MG/2ML IJ SOLN
4.0000 mg | Freq: Once | INTRAMUSCULAR | Status: AC | PRN
  Administered 2023-12-10: 4 mg via INTRAVENOUS
  Filled 2023-12-10: qty 2

## 2023-12-10 NOTE — ED Provider Notes (Signed)
 [place] EMERGENCY DEPARTMENT AT [facility] Provider Note   CSN: [id number] Arrival date & time: 12/10/23  2247     History {Add pertinent medical, surgical, social history, OB history to HPI:1} Chief Complaint  Patient presents with   Abdominal Pain    Virginia Olson is a 48 y.o. female.   Abdominal Pain      Home Medications Prior to Admission medications   Medication Sig Start Date End Date Taking? Authorizing Provider  acetaminophen (TYLENOL) 325 MG tablet Take 650 mg by mouth every 6 (six) hours as needed for mild pain, moderate pain or headache.    [provider]  albuterol (PROVENTIL HFA;VENTOLIN HFA) 108 (90 BASE) MCG/ACT inhaler Inhale 2 puffs into the lungs every 6 (six) hours as needed for wheezing or shortness of breath.     [provider]  amoxicillin-clavulanate (AUGMENTIN) 875-125 MG tablet Take 1 tablet by mouth every 12 (twelve) hours. 04/06/22   Arby Barrette, MD  budesonide-formoterol (SYMBICORT) 80-4.5 MCG/ACT inhaler Inhale 2 puffs into the lungs 2 (two) times daily as needed for shortness of breath. 01/05/18   [provider]  CRANBERRY PO Take 1 tablet by mouth daily.    [provider]  FIBER PO Take 1 tablet by mouth daily.    [provider]  metFORMIN (GLUCOPHAGE) 500 MG tablet Take 1 tablet by mouth 2 (two) times daily with a meal.    [provider]  metroNIDAZOLE (FLAGYL) 500 MG tablet Take 1 tablet (500 mg total) by mouth 2 (two) times daily. One po bid x 7 days 04/06/22   Arby Barrette, MD  ondansetron (ZOFRAN-ODT) 4 MG disintegrating tablet Take 1 tablet (4 mg total) by mouth every 4 (four) hours as needed for nausea or vomiting. 04/06/22   Arby Barrette, MD  oxyCODONE (OXY IR/ROXICODONE) 5 MG immediate release tablet Take 1-2 tablets (5-10 mg total) by mouth every 6 (six) hours as needed for moderate pain or severe pain. 12/27/17   Abigail Miyamoto, MD  oxyCODONE-acetaminophen  (PERCOCET) 5-325 MG tablet Take 1-2 tablets by mouth every 6 (six) hours as needed. 04/06/22   Arby Barrette, MD  Probiotic Product (PROBIOTIC PO) Take 1 capsule by mouth daily.    [provider]  S-Adenosylmethionine-B6-B12-FA (MOOD PLUS STRESS RELIEF PO) Take 1 tablet by mouth daily.    [provider]  spironolactone (ALDACTONE) 100 MG tablet Take 100 mg by mouth 2 (two) times daily. 12/25/20   [provider]  topiramate (TOPAMAX) 25 MG tablet Take 1 tablet (25 mg total) by mouth daily. 01/21/21   Patel, Roxana Hires K, DO  valACYclovir (VALTREX) 1000 MG tablet Take 1 tablet by mouth 2 (two) times daily as needed (cold sore). 07/10/17   [provider]      Allergies    Amoxicillin and Doxycycline    Review of Systems   Review of Systems  Gastrointestinal:  Positive for abdominal pain.    Physical Exam Updated Vital Signs BP 98/61 (BP Location: Right Arm)   Pulse 94   Temp 97.8 F (36.6 C)   Resp 20   LMP 03/16/2015 (Approximate)   SpO2 100%  Physical Exam  ED Results / Procedures / Treatments   Labs (all labs ordered are listed, but only abnormal results are displayed) Labs Reviewed  CBC WITH DIFFERENTIAL/PLATELET - Abnormal; Notable for the following components:      Result Value   Hemoglobin 11.5 (*)    HCT 33.4 (*)    All  other components within normal limits  LIPASE, BLOOD  COMPREHENSIVE METABOLIC PANEL  URINALYSIS, ROUTINE W REFLEX MICROSCOPIC    EKG None  Radiology No results found.  Procedures Procedures  {Document cardiac monitor, telemetry assessment procedure when appropriate:1}  Medications Ordered in ED Medications  ondansetron (ZOFRAN) injection 4 mg (4 mg Intravenous Given 12/10/23 2317)    ED Course/ Medical Decision Making/ A&P   {   Click here for ABCD2, HEART and other calculatorsREFRESH Note before signing :1}                              Medical Decision Making Amount and/or Complexity of Data  Reviewed Labs: ordered.  Risk Prescription drug management.   ***  {Document critical care time when appropriate:1} {Document review of labs and clinical decision tools ie heart score, Chads2Vasc2 etc:1}  {Document your independent review of radiology images, and any outside records:1} {Document your discussion with family members, caretakers, and with consultants:1} {Document social determinants of health affecting pt's care:1} {Document your decision making why or why not admission, treatments were needed:1} Final Clinical Impression(s) / ED Diagnoses Final diagnoses:  None    Rx / DC Orders ED Discharge Orders     None

## 2023-12-10 NOTE — ED Triage Notes (Addendum)
 Pt c/o of abd pain. States pain is coming and going after eating sushi tonight. Pt states she is tingling all over. Pt is hyperventilating on arrival to triage room. Encouraged pt to slow breathing. Pt states she vomited 8 times this evening. Pt c/o of feeling "hot" and "cold" Pt took Pepto and zofran po pta.

## 2023-12-11 ENCOUNTER — Emergency Department (HOSPITAL_BASED_OUTPATIENT_CLINIC_OR_DEPARTMENT_OTHER)

## 2023-12-11 ENCOUNTER — Encounter (HOSPITAL_COMMUNITY): Payer: Self-pay | Admitting: Family Medicine

## 2023-12-11 DIAGNOSIS — R10817 Generalized abdominal tenderness: Secondary | ICD-10-CM | POA: Diagnosis present

## 2023-12-11 DIAGNOSIS — E872 Acidosis, unspecified: Secondary | ICD-10-CM | POA: Diagnosis not present

## 2023-12-11 DIAGNOSIS — R109 Unspecified abdominal pain: Secondary | ICD-10-CM

## 2023-12-11 DIAGNOSIS — J45909 Unspecified asthma, uncomplicated: Secondary | ICD-10-CM | POA: Diagnosis not present

## 2023-12-11 DIAGNOSIS — Z5321 Procedure and treatment not carried out due to patient leaving prior to being seen by health care provider: Secondary | ICD-10-CM | POA: Diagnosis not present

## 2023-12-11 DIAGNOSIS — E66811 Obesity, class 1: Secondary | ICD-10-CM | POA: Diagnosis present

## 2023-12-11 DIAGNOSIS — R9431 Abnormal electrocardiogram [ECG] [EKG]: Secondary | ICD-10-CM | POA: Diagnosis present

## 2023-12-11 DIAGNOSIS — Z9889 Other specified postprocedural states: Secondary | ICD-10-CM | POA: Diagnosis not present

## 2023-12-11 DIAGNOSIS — R112 Nausea with vomiting, unspecified: Secondary | ICD-10-CM | POA: Diagnosis not present

## 2023-12-11 LAB — URINALYSIS, ROUTINE W REFLEX MICROSCOPIC
Bacteria, UA: NONE SEEN
Bilirubin Urine: NEGATIVE
Glucose, UA: NEGATIVE mg/dL
Hgb urine dipstick: NEGATIVE
Ketones, ur: >80 mg/dL — AB
Nitrite: NEGATIVE
Specific Gravity, Urine: 1.033 — ABNORMAL HIGH (ref 1.005–1.030)
pH: 5.5 (ref 5.0–8.0)

## 2023-12-11 LAB — HEMOGLOBIN A1C
Hgb A1c MFr Bld: 5.4 % (ref 4.8–5.6)
Mean Plasma Glucose: 108.28 mg/dL

## 2023-12-11 LAB — RAPID URINE DRUG SCREEN, HOSP PERFORMED
Amphetamines: NOT DETECTED
Barbiturates: NOT DETECTED
Benzodiazepines: NOT DETECTED
Cocaine: NOT DETECTED
Opiates: NOT DETECTED
Tetrahydrocannabinol: NOT DETECTED

## 2023-12-11 LAB — GLUCOSE, CAPILLARY
Glucose-Capillary: 130 mg/dL — ABNORMAL HIGH (ref 70–99)
Glucose-Capillary: 108 mg/dL — ABNORMAL HIGH (ref 70–99)
Glucose-Capillary: 109 mg/dL — ABNORMAL HIGH (ref 70–99)

## 2023-12-11 LAB — LACTIC ACID, PLASMA
Lactic Acid, Venous: 2.5 mmol/L (ref 0.5–1.9)
Lactic Acid, Venous: 1.5 mmol/L (ref 0.5–1.9)

## 2023-12-11 LAB — MAGNESIUM: Magnesium: 1.7 mg/dL (ref 1.7–2.4)

## 2023-12-11 MED ORDER — HYDROMORPHONE HCL 1 MG/ML IJ SOLN
0.5000 mg | INTRAMUSCULAR | Status: DC | PRN
  Administered 2023-12-11: 0.5 mg via INTRAVENOUS
  Filled 2023-12-11: qty 0.5

## 2023-12-11 MED ORDER — PROCHLORPERAZINE EDISYLATE 10 MG/2ML IJ SOLN
10.0000 mg | Freq: Four times a day (QID) | INTRAMUSCULAR | Status: DC | PRN
  Administered 2023-12-12: 10 mg via INTRAVENOUS
  Filled 2023-12-11: qty 2

## 2023-12-11 MED ORDER — ONDANSETRON HCL 4 MG/2ML IJ SOLN
4.0000 mg | Freq: Once | INTRAMUSCULAR | Status: AC | PRN
  Administered 2023-12-11: 4 mg via INTRAVENOUS

## 2023-12-11 MED ORDER — INSULIN ASPART 100 UNIT/ML IJ SOLN: 0.0000 [IU] | Freq: Every day | INTRAMUSCULAR | Status: DC

## 2023-12-11 MED ORDER — SODIUM CHLORIDE 0.9 % IV BOLUS
1000.0000 mL | Freq: Once | INTRAVENOUS | Status: AC
  Administered 2023-12-11: 1000 mL via INTRAVENOUS

## 2023-12-11 MED ORDER — MOMETASONE FURO-FORMOTEROL FUM 100-5 MCG/ACT IN AERO
2.0000 | INHALATION_SPRAY | Freq: Two times a day (BID) | RESPIRATORY_TRACT | Status: DC
  Administered 2023-12-11 – 2023-12-13 (×5): 2 via RESPIRATORY_TRACT
  Filled 2023-12-11: qty 8.8

## 2023-12-11 MED ORDER — FENTANYL CITRATE PF 50 MCG/ML IJ SOSY
50.0000 ug | PREFILLED_SYRINGE | Freq: Once | INTRAMUSCULAR | Status: AC
  Administered 2023-12-11: 50 ug via INTRAVENOUS
  Filled 2023-12-11: qty 1

## 2023-12-11 MED ORDER — POTASSIUM CHLORIDE CRYS ER 20 MEQ PO TBCR
40.0000 meq | EXTENDED_RELEASE_TABLET | Freq: Once | ORAL | Status: DC
  Filled 2023-12-11: qty 2

## 2023-12-11 MED ORDER — SODIUM CHLORIDE 0.9 % IV BOLUS: 1000.0000 mL | Freq: Once | INTRAVENOUS | Status: DC

## 2023-12-11 MED ORDER — LORAZEPAM 2 MG/ML IJ SOLN
1.0000 mg | Freq: Once | INTRAMUSCULAR | Status: AC
  Administered 2023-12-11: 1 mg via INTRAVENOUS
  Filled 2023-12-11: qty 1

## 2023-12-11 MED ORDER — DROPERIDOL 2.5 MG/ML IJ SOLN: 2.5000 mg | Freq: Once | INTRAMUSCULAR | Status: DC

## 2023-12-11 MED ORDER — LACTATED RINGERS IV BOLUS
1000.0000 mL | Freq: Once | INTRAVENOUS | Status: AC
  Administered 2023-12-11: 1000 mL via INTRAVENOUS

## 2023-12-11 MED ORDER — ACETAMINOPHEN 325 MG PO TABS
650.0000 mg | ORAL_TABLET | Freq: Four times a day (QID) | ORAL | Status: DC | PRN
  Administered 2023-12-12 – 2023-12-13 (×2): 650 mg via ORAL
  Filled 2023-12-11 (×2): qty 2

## 2023-12-11 MED ORDER — INSULIN ASPART 100 UNIT/ML IJ SOLN
0.0000 [IU] | Freq: Three times a day (TID) | INTRAMUSCULAR | Status: DC
  Administered 2023-12-11: 2 [IU] via SUBCUTANEOUS

## 2023-12-11 MED ORDER — ONDANSETRON HCL 4 MG/2ML IJ SOLN: 4.0000 mg | Freq: Once | INTRAMUSCULAR | Status: DC

## 2023-12-11 MED ORDER — DROPERIDOL 2.5 MG/ML IJ SOLN
2.5000 mg | Freq: Once | INTRAMUSCULAR | Status: AC
  Administered 2023-12-11: 2.5 mg via INTRAVENOUS
  Filled 2023-12-11: qty 2

## 2023-12-11 MED ORDER — FAMOTIDINE IN NACL 20-0.9 MG/50ML-% IV SOLN
20.0000 mg | Freq: Once | INTRAVENOUS | Status: AC
  Administered 2023-12-11: 20 mg via INTRAVENOUS
  Filled 2023-12-11: qty 50

## 2023-12-11 MED ORDER — PANTOPRAZOLE SODIUM 40 MG IV SOLR
40.0000 mg | Freq: Once | INTRAVENOUS | Status: AC
  Administered 2023-12-11: 40 mg via INTRAVENOUS
  Filled 2023-12-11: qty 10

## 2023-12-11 MED ORDER — IOHEXOL 300 MG/ML  SOLN
100.0000 mL | Freq: Once | INTRAMUSCULAR | Status: AC | PRN
  Administered 2023-12-11: 100 mL via INTRAVENOUS

## 2023-12-11 MED ORDER — FLEET ENEMA RE ENEM: 1.0000 | ENEMA | Freq: Once | RECTAL | Status: DC

## 2023-12-11 MED ORDER — ALUM & MAG HYDROXIDE-SIMETH 200-200-20 MG/5ML PO SUSP
30.0000 mL | Freq: Once | ORAL | Status: DC
  Filled 2023-12-11: qty 30

## 2023-12-11 MED ORDER — ACETAMINOPHEN 650 MG RE SUPP: 650.0000 mg | Freq: Four times a day (QID) | RECTAL | Status: DC | PRN

## 2023-12-11 MED ORDER — MAGNESIUM SULFATE 2 GM/50ML IV SOLN
2.0000 g | Freq: Once | INTRAVENOUS | Status: AC
  Administered 2023-12-11: 2 g via INTRAVENOUS
  Filled 2023-12-11: qty 50

## 2023-12-11 MED ORDER — POTASSIUM CHLORIDE IN NACL 20-0.9 MEQ/L-% IV SOLN
INTRAVENOUS | Status: AC
  Filled 2023-12-11 (×2): qty 1000

## 2023-12-11 MED ORDER — ONDANSETRON HCL 4 MG/2ML IJ SOLN
INTRAMUSCULAR | Status: AC
Start: 2023-12-11 — End: 2023-12-11
  Filled 2023-12-11: qty 2

## 2023-12-11 MED ORDER — LIDOCAINE VISCOUS HCL 2 % MT SOLN
15.0000 mL | Freq: Once | OROMUCOSAL | Status: DC
  Filled 2023-12-11: qty 15

## 2023-12-11 MED ORDER — DICYCLOMINE HCL 10 MG PO CAPS
10.0000 mg | ORAL_CAPSULE | Freq: Once | ORAL | Status: DC
  Filled 2023-12-11: qty 1

## 2023-12-11 MED ORDER — ONDANSETRON HCL 4 MG/2ML IJ SOLN
4.0000 mg | Freq: Four times a day (QID) | INTRAMUSCULAR | Status: DC | PRN
  Administered 2023-12-11: 4 mg via INTRAVENOUS
  Filled 2023-12-11: qty 2

## 2023-12-11 NOTE — ED Notes (Signed)
 ED TO INPATIENT HANDOFF REPORT  ED Nurse Name and Phone #: Corrie Dandy [phone]  S Name/Age/Gender Ryley Richison 48 y.o. female Room/Bed: DB011/DB011  Code Status   Code Status: Prior  Home/SNF/Other Home Patient oriented to: self, place, time, and situation Is this baseline? Yes   Triage Complete: Triage complete  Chief Complaint Abdominal pain [R10.9]  Triage Note Pt c/o of abd pain. States pain is coming and going after eating sushi tonight. Pt states she is tingling all over. Pt is hyperventilating on arrival to triage room. Encouraged pt to slow breathing. Pt states she vomited 8 times this evening. Pt c/o of feeling "hot" and "cold" Pt took Pepto and zofran po pta.    Allergies Allergies  Allergen Reactions   Amoxicillin    Doxycycline     Level of Care/Admitting Diagnosis ED Disposition     ED Disposition  Admit   Condition  --   Comment  Hospital Area: [facility] [[id number]]  Level of Care: Med-Surg [16]  Interfacility transfer: Yes  May place patient in observation at [facility] or [facility] if equivalent level of care is available:: Yes  Covid Evaluation: Asymptomatic - no recent exposure (last 10 days) testing not required  Diagnosis: Abdominal pain [213086]  Admitting Physician: Gery Pray [[id number]]  Attending Physician: Alvester Chou          B Medical/Surgery History Past Medical History:  Diagnosis Date   Anemia    Asthma    bronchitis     Endometriosis    Fibroids 12/12/2014   Nausea with vomiting    Past Surgical History:  Procedure Laterality Date   ABDOMINAL HYSTERECTOMY     BREAST EXCISIONAL BIOPSY Left 11/29/2017   fibroadenoma   BREAST LUMPECTOMY WITH RADIOACTIVE SEED LOCALIZATION Left 12/27/2017   Procedure: BREAST LUMPECTOMY WITH RADIOACTIVE SEED LOCALIZATION;  Surgeon: Abigail Miyamoto, MD;  Location: [facility] OR;  Service: General;  Laterality: Left;   IUD REMOVAL N/A 04/15/2015   Procedure:  INTRAUTERINE DEVICE (IUD) REMOVAL;  Surgeon: Gerald Leitz, MD;  Location: [facility] ORS;  Service: Gynecology;  Laterality: N/A;   laproscopy     ROBOTIC ASSISTED TOTAL HYSTERECTOMY WITH BILATERAL SALPINGO OOPHERECTOMY Right 04/15/2015   Procedure: ROBOTIC ASSISTED TOTAL HYSTERECTOMY WITH BILATERAL SALPINGOECTOMY and RIGHT OOOPHORECTOMY;  Surgeon: Gerald Leitz, MD;  Location: [facility] OR[facility];  Service: Gynecology;  Laterality: Right;   TUBAL LIGATION       A IV Location/Drains/Wounds Patient Lines/Drains/Airways Status     Active Line/Drains/Airways     Name Placement date Placement time Site Days   Peripheral IV 20 G Left Antecubital --  --  Antecubital  --            Intake/Output Last 24 hours  Intake/Output Summary (Last 24 hours) at 12/11/2023 5784 Last data filed at 12/11/2023 0546 Gross per 24 hour  Intake 2018 ml  Output --  Net 2018 ml    Labs/Imaging Results for orders placed or performed during the hospital encounter of 12/10/23 (from the past 48 hours)  Lipase, blood     Status: None   Collection Time: 12/10/23 10:53 PM  Result Value Ref Range   Lipase 43 11 - 51 U/L    Comment: Performed at [facility], [address] [place], [place] [address]  Comprehensive metabolic panel     Status: Abnormal   Collection Time: 12/10/23 10:53 PM  Result Value Ref Range   Sodium 134 (L) 135 - 145 mmol/L  Potassium 3.2 (L) 3.5 - 5.1 mmol/L   Chloride 100 98 - 111 mmol/L   CO2 21 (L) 22 - 32 mmol/L   Glucose, Bld 164 (H) 70 - 99 mg/dL    Comment: Glucose reference range applies only to samples taken after fasting for at least 8 hours.   BUN 13 6 - 20 mg/dL   Creatinine, Ser 4.09 (H) 0.44 - 1.00 mg/dL   Calcium 9.9 8.9 - 81.1 mg/dL   Total Protein 7.3 6.5 - 8.1 g/dL   Albumin 4.2 3.5 - 5.0 g/dL   AST 23 15 - 41 U/L   ALT 14 0 - 44 U/L   Alkaline Phosphatase 87 38 - 126 U/L   Total Bilirubin 0.4 0.0 - 1.2 mg/dL   GFR, Estimated >91 >47 mL/min    Comment:  (NOTE) Calculated using the CKD-EPI Creatinine Equation (2021)    Anion gap 13 5 - 15    Comment: Performed at [facility], [address] [place], [place] [address]  CBC with Differential     Status: Abnormal   Collection Time: 12/10/23 10:53 PM  Result Value Ref Range   WBC 9.2 4.0 - 10.5 K/uL   RBC 3.95 3.87 - 5.11 MIL/uL   Hemoglobin 11.5 (L) 12.0 - 15.0 g/dL   HCT 21.3 (L) 08.6 - 57.8 %   MCV 84.6 80.0 - 100.0 fL   MCH 29.1 26.0 - 34.0 pg   MCHC 34.4 30.0 - 36.0 g/dL   RDW 46.9 62.9 - 52.8 %   Platelets 340 150 - 400 K/uL   nRBC 0.0 0.0 - 0.2 %   Neutrophils Relative % 67 %   Neutro Abs 6.2 1.7 - 7.7 K/uL   Lymphocytes Relative 26 %   Lymphs Abs 2.4 0.7 - 4.0 K/uL   Monocytes Relative 6 %   Monocytes Absolute 0.6 0.1 - 1.0 K/uL   Eosinophils Relative 0 %   Eosinophils Absolute 0.0 0.0 - 0.5 K/uL   Basophils Relative 1 %   Basophils Absolute 0.1 0.0 - 0.1 K/uL   Immature Granulocytes 0 %   Abs Immature Granulocytes 0.03 0.00 - 0.07 K/uL    Comment: Performed at [facility], [address], [place], [place] [address]  Magnesium     Status: None   Collection Time: 12/10/23 10:53 PM  Result Value Ref Range   Magnesium 1.7 1.7 - 2.4 mg/dL    Comment: Performed at [facility], [address] [place], [place] [address]  Urinalysis, Routine w reflex microscopic -Urine, Clean Catch     Status: Abnormal   Collection Time: 12/11/23 12:22 AM  Result Value Ref Range   Color, Urine YELLOW YELLOW   APPearance CLEAR CLEAR   Specific Gravity, Urine 1.033 (H) 1.005 - 1.030   pH 5.5 5.0 - 8.0   Glucose, UA NEGATIVE NEGATIVE mg/dL   Hgb urine dipstick NEGATIVE NEGATIVE   Bilirubin Urine NEGATIVE NEGATIVE   Ketones, ur >80 (A) NEGATIVE mg/dL   Protein, ur TRACE (A) NEGATIVE mg/dL   Nitrite NEGATIVE NEGATIVE   Leukocytes,Ua TRACE (A) NEGATIVE   RBC / HPF 0-5 0 - 5 RBC/hpf   WBC, UA 0-5 0 - 5 WBC/hpf   Bacteria, UA  NONE SEEN NONE SEEN   Squamous Epithelial / HPF 0-5 0 - 5 /HPF   Mucus PRESENT     Comment: Performed at [facility], [address] [place], [place] [address]  Rapid urine drug screen (hospital performed)  Status: None   Collection Time: 12/11/23 12:22 AM  Result Value Ref Range   Opiates NONE DETECTED NONE DETECTED   Cocaine NONE DETECTED NONE DETECTED   Benzodiazepines NONE DETECTED NONE DETECTED   Amphetamines NONE DETECTED NONE DETECTED   Tetrahydrocannabinol NONE DETECTED NONE DETECTED   Barbiturates NONE DETECTED NONE DETECTED    Comment: (NOTE) DRUG SCREEN FOR MEDICAL PURPOSES ONLY.  IF CONFIRMATION IS NEEDED FOR ANY PURPOSE, NOTIFY LAB WITHIN 5 DAYS.  LOWEST DETECTABLE LIMITS FOR URINE DRUG SCREEN Drug Class                     Cutoff (ng/mL) Amphetamine and metabolites    1000 Barbiturate and metabolites    200 Benzodiazepine                 200 Opiates and metabolites        300 Cocaine and metabolites        300 THC                            50 Performed at [facility], [address] [place], [place] [address]   Lactic acid, plasma     Status: Abnormal   Collection Time: 12/11/23  3:15 AM  Result Value Ref Range   Lactic Acid, Venous 2.5 (HH) 0.5 - 1.9 mmol/L    Comment: CRITICAL RESULT CALLED TO, READ BACK BY AND VERIFIED WITH: CALLED TO Yevette Edwards RN [phone] 12/11/23 BY Bartolo Darter BOWMAN Performed at [facility], [address] [place], [place] [address]   Lactic acid, plasma     Status: None   Collection Time: 12/11/23  3:15 AM  Result Value Ref Range   Lactic Acid, Venous 1.5 0.5 - 1.9 mmol/L    Comment: Performed at [facility], [address], [place], [place] [address]   CT ABDOMEN PELVIS W CONTRAST Result Date: 12/11/2023 CLINICAL DATA:  Diverticulitis, complication suspected EXAM: CT ABDOMEN AND PELVIS WITH CONTRAST TECHNIQUE: Multidetector CT imaging of the  abdomen and pelvis was performed using the standard protocol following bolus administration of intravenous contrast. RADIATION DOSE REDUCTION: This exam was performed according to the departmental dose-optimization program which includes automated exposure control, adjustment of the mA and/or kV according to patient size and/or use of iterative reconstruction technique. CONTRAST:  OMNIPAQUE IOHEXOL 300 MG/ML  SOLN COMPARISON:  CT abdomen pelvis 04/06/2022 FINDINGS: Lower chest: No acute abnormality.  Tiny hiatal hernia. Hepatobiliary: The liver is enlarged measuring up to 19 cm. No focal liver abnormality. No gallstones, gallbladder wall thickening, or pericholecystic fluid. No biliary dilatation. Pancreas: No focal lesion. Normal pancreatic contour. No surrounding inflammatory changes. No main pancreatic ductal dilatation. Spleen: Normal in size without focal abnormality. Adrenals/Urinary Tract: No adrenal nodule bilaterally. Bilateral kidneys enhance symmetrically. Bilateral simple parapelvic cysts. Simple renal cysts, in the absence of clinically indicated signs/symptoms, require no independent follow-up. No hydronephrosis. No hydroureter.  No nephroureterolithiasis. The urinary bladder is unremarkable. Stomach/Bowel: Stomach is within normal limits. No evidence of bowel wall thickening or dilatation. Colonic diverticulosis. Appendix appears normal. Vascular/Lymphatic: No abdominal aorta or iliac aneurysm. No abdominal, pelvic, or inguinal lymphadenopathy. Reproductive: Status post hysterectomy. No adnexal masses. Other: No intraperitoneal free fluid. No intraperitoneal free gas. No organized fluid collection. Musculoskeletal: No abdominal wall hernia or abnormality. No suspicious lytic or blastic osseous lesions. No acute displaced fracture. Multilevel degenerative changes of the spine. IMPRESSION: 1. Colonic diverticulosis  with no acute diverticulitis. 2. Tiny hiatal hernia. 3. Hepatomegaly.  Electronically Signed   By: Tish Frederickson M.D.   On: 12/11/2023 01:58    Pending Labs Unresulted Labs (From admission, onward)     Start     Ordered   12/11/23 0547  Hemoglobin A1c  Once,   URGENT       Comments: To assess prior glycemic control    12/11/23 0546            Vitals/Pain Today's Vitals   12/11/23 0415 12/11/23 0430 12/11/23 0439 12/11/23 0507  BP: 114/68 111/67    Pulse: 81 91    Resp: 20 (!) 24    Temp:      TempSrc:      SpO2: 100% 99%    PainSc:   3  4     Isolation Precautions No active isolations  Medications Medications  potassium chloride SA (KLOR-CON M) CR tablet 40 mEq (0 mEq Oral Hold 12/11/23 0256)  alum & mag hydroxide-simeth (MAALOX/MYLANTA) 200-200-20 MG/5ML suspension 30 mL (0 mLs Oral Hold 12/11/23 0257)    And  lidocaine (XYLOCAINE) 2 % viscous mouth solution 15 mL (0 mLs Oral Hold 12/11/23 0257)  dicyclomine (BENTYL) capsule 10 mg (0 mg Oral Hold 12/11/23 0257)  ondansetron (ZOFRAN) 4 MG/2ML injection (  Not Given 12/11/23 0325)  ondansetron (ZOFRAN) injection 4 mg (has no administration in time range)  HYDROmorphone (DILAUDID) injection 0.5 mg (has no administration in time range)  0.9 % NaCl with KCl 20 mEq/ L  infusion ( Intravenous New Bag/Given 12/11/23 0558)  mometasone-formoterol (DULERA) 100-5 MCG/ACT inhaler 2 puff (has no administration in time range)  insulin aspart (novoLOG) injection 0-15 Units (has no administration in time range)  insulin aspart (novoLOG) injection 0-5 Units (has no administration in time range)  ondansetron (ZOFRAN) injection 4 mg (4 mg Intravenous Given 12/10/23 2317)  lactated ringers bolus 1,000 mL (0 mLs Intravenous Stopped 12/11/23 0138)  fentaNYL (SUBLIMAZE) injection 50 mcg (50 mcg Intravenous Given 12/11/23 0035)  LORazepam (ATIVAN) injection 1 mg (1 mg Intravenous Given 12/11/23 0035)  iohexol (OMNIPAQUE) 300 MG/ML solution 100 mL (100 mLs Intravenous Contrast Given 12/11/23 0145)  droperidol  (INAPSINE) 2.5 MG/ML injection 2.5 mg (2.5 mg Intravenous Given 12/11/23 0342)  fentaNYL (SUBLIMAZE) injection 50 mcg (50 mcg Intravenous Given 12/11/23 0313)  famotidine (PEPCID) IVPB 20 mg premix (0 mg Intravenous Stopped 12/11/23 0425)  ondansetron (ZOFRAN) injection 4 mg (4 mg Intravenous Given 12/11/23 0312)  sodium chloride 0.9 % bolus 1,000 mL (0 mLs Intravenous Stopped 12/11/23 0546)  fentaNYL (SUBLIMAZE) injection 50 mcg (50 mcg Intravenous Given 12/11/23 0507)    Mobility walks     Focused Assessments GI   R Recommendations: See Admitting Provider Note  Report given to:   Additional Notes: Pt's husband at [facility]

## 2023-12-11 NOTE — ED Notes (Signed)
 Pt placed on o2 by RN for sats of 93.

## 2023-12-11 NOTE — Progress Notes (Signed)
   12/11/23 1111  TOC Brief Assessment  Insurance and Status Reviewed  Patient has primary care physician Yes  Home environment has been reviewed home w/ family  Prior level of function: independent  Prior/Current Home Services No current home services  Social Drivers of Health Review SDOH reviewed no interventions necessary  Readmission risk has been reviewed Yes  Transition of care needs no transition of care needs at this time

## 2023-12-11 NOTE — ED Notes (Addendum)
-  Called [facility] at 604am for transportation.

## 2023-12-11 NOTE — Plan of Care (Signed)

## 2023-12-11 NOTE — ED Notes (Signed)
 Dr. Karene Fry aware of lactic of 2.5

## 2023-12-11 NOTE — ED Notes (Signed)
2L O2 applied.

## 2023-12-11 NOTE — H&P (Signed)
 History and Physical    Patient: Virginia Olson XBJ:[id number] DOB: 05-15-1976 DOA: 12/10/2023 DOS: the patient was seen and examined on 12/11/2023 PCP: [facility]  Patient coming from: Home  Chief Complaint:  Chief Complaint  Patient presents with   Abdominal Pain   HPI: Virginia Olson is a 48 y.o. female with medical history significant of menorrhagia, fibroids, status post laparoscopic hysterectomy, anemia, tremor, hypokalemia, obesity who presented to the emergency department with complaints of abdominal pain associated with nausea and multiple episodes of emesis after eating sushi.  The patient stated that the last 2 weeks she ate were tasting very badly and she is spit them in a napkin.  Shortly after this she started having nausea and abdominal pain.  This was followed by multiple episodes of emesis. He denied fever, chills, rhinorrhea, sore throat, wheezing or hemoptysis.  No chest pain, palpitations, diaphoresis, PND, orthopnea or pitting edema of the lower extremities.  No abdominal pain, nausea, emesis, diarrhea, constipation, melena or hematochezia.  No flank pain, dysuria, frequency or hematuria.  No polyuria, polydipsia, polyphagia or blurred vision.   Lab work: Urinalysis showed increased specific gravity, ketones greater than 80 mg/dL, trace protein and trace leukocyte esterase.  UDS was negative.  CBC showed white count 9.2, hemoglobin 11.5 g/dL platelets 308.  Lipase was 43.  Lactic acid was 2.5 and 1.5 mmol/L.  Normal hemoglobin A1c.  Imaging: CT abdomen and pelvis with contrast showed colonic diverticulosis with no diverticulitis.  Tiny hiatal hernia.  Hepatomegaly.  ED course: Initial vital signs were temperature 97.8 F, pulse 94, respiration 20, BP 98/61 mmHg O2 sat 100% on room air.  Patient received droperidol 2.5 mg IV, famotidine 20 mg IVP, fentanyl 50 mcg IVP, lorazepam 1 mg IVP, LR 1000 mL bolus, normal saline 2000 mm bolus, ondansetron 4 mg  IVP prior to arriving to the hospital.  I added pantoprazole 40 mg IVP and magnesium sulfate 2 g IVPB.   Review of Systems: As mentioned in the history of present illness. All other systems reviewed and are negative.  Past Medical History:  Diagnosis Date   Anemia    Asthma    bronchitis     Endometriosis    Fibroids 12/12/2014   Nausea with vomiting    Past Surgical History:  Procedure Laterality Date   ABDOMINAL HYSTERECTOMY     BREAST EXCISIONAL BIOPSY Left 11/29/2017   fibroadenoma   BREAST LUMPECTOMY WITH RADIOACTIVE SEED LOCALIZATION Left 12/27/2017   Procedure: BREAST LUMPECTOMY WITH RADIOACTIVE SEED LOCALIZATION;  Surgeon: Abigail Miyamoto, MD;  Location: [facility] OR;  Service: General;  Laterality: Left;   IUD REMOVAL N/A 04/15/2015   Procedure: INTRAUTERINE DEVICE (IUD) REMOVAL;  Surgeon: Gerald Leitz, MD;  Location: [facility];  Service: Gynecology;  Laterality: N/A;   laproscopy     ROBOTIC ASSISTED TOTAL HYSTERECTOMY WITH BILATERAL SALPINGO OOPHERECTOMY Right 04/15/2015   Procedure: ROBOTIC ASSISTED TOTAL HYSTERECTOMY WITH BILATERAL SALPINGOECTOMY and RIGHT OOOPHORECTOMY;  Surgeon: Gerald Leitz, MD;  Location: [facility];  Service: Gynecology;  Laterality: Right;   TUBAL LIGATION     Social History:  reports that she has never smoked. She has been exposed to tobacco smoke. She has never used smokeless tobacco. She reports current alcohol use. She reports that she does not use drugs.  Allergies  Allergen Reactions   Amoxicillin    Doxycycline     Family History  Problem Relation Age of Onset   Diabetes Father    Hypertension Father  Anemia Mother    Thyroid disease Neg Hx     Prior to Admission medications   Medication Sig Start Date End Date Taking? Authorizing Provider  acetaminophen (TYLENOL) 325 MG tablet Take 650 mg by mouth every 6 (six) hours as needed for mild pain, moderate pain or headache.    [provider]  albuterol (PROVENTIL HFA;VENTOLIN HFA) 108 (90  BASE) MCG/ACT inhaler Inhale 2 puffs into the lungs every 6 (six) hours as needed for wheezing or shortness of breath.     [provider]  amoxicillin-clavulanate (AUGMENTIN) 875-125 MG tablet Take 1 tablet by mouth every 12 (twelve) hours. 04/06/22   Arby Barrette, MD  budesonide-formoterol (SYMBICORT) 80-4.5 MCG/ACT inhaler Inhale 2 puffs into the lungs 2 (two) times daily as needed for shortness of breath. 01/05/18   [provider]  CRANBERRY PO Take 1 tablet by mouth daily.    [provider]  FIBER PO Take 1 tablet by mouth daily.    [provider]  metFORMIN (GLUCOPHAGE) 500 MG tablet Take 1 tablet by mouth 2 (two) times daily with a meal.    [provider]  metroNIDAZOLE (FLAGYL) 500 MG tablet Take 1 tablet (500 mg total) by mouth 2 (two) times daily. One po bid x 7 days 04/06/22   Arby Barrette, MD  ondansetron (ZOFRAN-ODT) 4 MG disintegrating tablet Take 1 tablet (4 mg total) by mouth every 4 (four) hours as needed for nausea or vomiting. 04/06/22   Arby Barrette, MD  oxyCODONE (OXY IR/ROXICODONE) 5 MG immediate release tablet Take 1-2 tablets (5-10 mg total) by mouth every 6 (six) hours as needed for moderate pain or severe pain. 12/27/17   Abigail Miyamoto, MD  oxyCODONE-acetaminophen (PERCOCET) 5-325 MG tablet Take 1-2 tablets by mouth every 6 (six) hours as needed. 04/06/22   Arby Barrette, MD  Probiotic Product (PROBIOTIC PO) Take 1 capsule by mouth daily.    [provider]  S-Adenosylmethionine-B6-B12-FA (MOOD PLUS STRESS RELIEF PO) Take 1 tablet by mouth daily.    [provider]  spironolactone (ALDACTONE) 100 MG tablet Take 100 mg by mouth 2 (two) times daily. 12/25/20   [provider]  topiramate (TOPAMAX) 25 MG tablet Take 1 tablet (25 mg total) by mouth daily. 01/21/21   Patel, Roxana Hires K, DO  valACYclovir (VALTREX) 1000 MG tablet Take 1 tablet by mouth 2 (two) times daily as needed (cold sore). 07/10/17    [provider]    Physical Exam: Vitals:   12/11/23 0430 12/11/23 0615 12/11/23 0634 12/11/23 0701  BP: 111/67 119/72  (!) 152/92  Pulse: 91 92  84  Resp: (!) 24 20  18   Temp:   98.7 F (37.1 C) (!) 100.4 F (38 C)  TempSrc:   Oral Oral  SpO2: 99% 100%  99%   Physical Exam  Data Reviewed:  Results are pending, will review when available. EKG: Vent. rate 90 BPM PR interval 166 ms QRS duration 96 ms QT/QTcB 398/487 ms P-R-T axes 70 63 48 Sinus arrhythmia Probable left ventricular hypertrophy Borderline prolonged QT interval  Assessment and Plan: Principal Problem:   Abdominal pain Nausea with vomiting Observation/MedSurg. Continue IV fluids. Keep n.p.o. for now. Analgesics as needed. Antiemetics as needed. Pantoprazole 40 mg IVP x 1. Follow CBC, CMP in AM.  Active Problems:   Borderline prolonged QT interval Avoid QT prolonging meds as possible. Potassium level optimized. Magnesium sulfate 2 g IVPB given.    Obesity, class 1 Current BMI still pending.  Would benefit from lifestyle modifications. Follow-up closely with PCP and/or bariatric clinic.    Asthma Bronchodilators as needed.    Advance Care Planning:   Code Status: Full Code   Consults:   Family Communication:   Severity of Illness: The appropriate patient status for this patient is INPATIENT. Inpatient status is judged to be reasonable and necessary in order to provide the required intensity of service to ensure the patient's safety. The patient's presenting symptoms, physical exam findings, and initial radiographic and laboratory data in the context of their chronic comorbidities is felt to place them at high risk for further clinical deterioration. Furthermore, it is not anticipated that the patient will be medically stable for discharge from the hospital within 2 midnights of admission.   * I certify that at the point of admission it is my clinical judgment that the patient will  require inpatient hospital care spanning beyond 2 midnights from the point of admission due to high intensity of service, high risk for further deterioration and high frequency of surveillance required.*  Author: Bobette Mo, MD 12/11/2023 8:00 AM  For on call review [email or web address].   This document was prepared using Dragon voice recognition software and may contain some unintended transcription errors.

## 2023-12-12 DIAGNOSIS — A09 Infectious gastroenteritis and colitis, unspecified: Secondary | ICD-10-CM

## 2023-12-12 DIAGNOSIS — R1084 Generalized abdominal pain: Secondary | ICD-10-CM

## 2023-12-12 LAB — COMPREHENSIVE METABOLIC PANEL
ALT: 18 U/L (ref 0–44)
AST: 25 U/L (ref 15–41)
Albumin: 3.1 g/dL — ABNORMAL LOW (ref 3.5–5.0)
Alkaline Phosphatase: 67 U/L (ref 38–126)
Anion gap: 3 — ABNORMAL LOW (ref 5–15)
BUN: 6 mg/dL (ref 6–20)
CO2: 25 mmol/L (ref 22–32)
Calcium: 8.9 mg/dL (ref 8.9–10.3)
Chloride: 109 mmol/L (ref 98–111)
Creatinine, Ser: 0.8 mg/dL (ref 0.44–1.00)
GFR, Estimated: >60 mL/min (ref >60)
Glucose, Bld: 101 mg/dL — ABNORMAL HIGH (ref 70–99)
Potassium: 4.2 mmol/L (ref 3.5–5.1)
Sodium: 137 mmol/L (ref 135–145)
Total Bilirubin: 0.9 mg/dL (ref 0.0–1.2)
Total Protein: 6.1 g/dL — ABNORMAL LOW (ref 6.5–8.1)

## 2023-12-12 LAB — CBC
HCT: 32.1 % — ABNORMAL LOW (ref 36.0–46.0)
Hemoglobin: 10.4 g/dL — ABNORMAL LOW (ref 12.0–15.0)
MCH: 29.1 pg (ref 26.0–34.0)
MCHC: 32.4 g/dL (ref 30.0–36.0)
MCV: 89.7 fL (ref 80.0–100.0)
Platelets: 284 10*3/uL (ref 150–400)
RBC: 3.58 MIL/uL — ABNORMAL LOW (ref 3.87–5.11)
RDW: 14.7 % (ref 11.5–15.5)
WBC: 5.7 10*3/uL (ref 4.0–10.5)
nRBC: 0 % (ref 0.0–0.2)

## 2023-12-12 LAB — GLUCOSE, CAPILLARY
Glucose-Capillary: 109 mg/dL — ABNORMAL HIGH (ref 70–99)
Glucose-Capillary: 95 mg/dL (ref 70–99)
Glucose-Capillary: 114 mg/dL — ABNORMAL HIGH (ref 70–99)

## 2023-12-12 LAB — HIV ANTIBODY (ROUTINE TESTING W REFLEX): HIV Screen 4th Generation wRfx: NONREACTIVE

## 2023-12-12 MED ORDER — POLYETHYLENE GLYCOL 3350 17 G PO PACK
17.0000 g | PACK | Freq: Two times a day (BID) | ORAL | Status: DC
  Administered 2023-12-12: 17 g via ORAL
  Filled 2023-12-12 (×2): qty 1

## 2023-12-12 MED ORDER — METOCLOPRAMIDE HCL 5 MG/ML IJ SOLN
10.0000 mg | Freq: Three times a day (TID) | INTRAMUSCULAR | Status: AC
  Administered 2023-12-12 – 2023-12-13 (×3): 10 mg via INTRAVENOUS
  Filled 2023-12-12 (×3): qty 2

## 2023-12-12 MED ORDER — ONDANSETRON HCL 4 MG/2ML IJ SOLN
4.0000 mg | Freq: Three times a day (TID) | INTRAMUSCULAR | Status: DC | PRN
  Administered 2023-12-12 – 2023-12-14 (×5): 4 mg via INTRAVENOUS
  Filled 2023-12-12 (×6): qty 2

## 2023-12-12 MED ORDER — HYDROMORPHONE HCL 1 MG/ML IJ SOLN
0.5000 mg | Freq: Once | INTRAMUSCULAR | Status: AC | PRN
  Administered 2023-12-12: 0.5 mg via INTRAVENOUS
  Filled 2023-12-12: qty 0.5

## 2023-12-12 MED ORDER — HYDROXYZINE HCL 25 MG PO TABS
25.0000 mg | ORAL_TABLET | Freq: Three times a day (TID) | ORAL | Status: DC | PRN
  Administered 2023-12-12: 25 mg via ORAL
  Filled 2023-12-12 (×2): qty 1

## 2023-12-12 MED ORDER — PROCHLORPERAZINE EDISYLATE 10 MG/2ML IJ SOLN
5.0000 mg | Freq: Once | INTRAMUSCULAR | Status: AC | PRN
  Administered 2023-12-13: 5 mg via INTRAVENOUS
  Filled 2023-12-12: qty 2

## 2023-12-12 MED ORDER — HYDROXYZINE HCL 25 MG PO TABS
25.0000 mg | ORAL_TABLET | Freq: Three times a day (TID) | ORAL | Status: DC | PRN
  Administered 2023-12-13 (×2): 50 mg via ORAL
  Filled 2023-12-12 (×2): qty 2

## 2023-12-12 MED ORDER — SODIUM CHLORIDE 0.9 % IV SOLN: INTRAVENOUS | Status: AC

## 2023-12-12 MED ORDER — LORAZEPAM 2 MG/ML IJ SOLN
0.5000 mg | Freq: Once | INTRAMUSCULAR | Status: AC
  Administered 2023-12-12: 0.5 mg via INTRAVENOUS
  Filled 2023-12-12: qty 1

## 2023-12-12 NOTE — Progress Notes (Signed)
 Pts temp 100.3 Jamelle Rushing MD notified.  MD said to give PRN tylenol, tylenol given @1711 

## 2023-12-12 NOTE — Plan of Care (Signed)
  Problem: Coping: Goal: Ability to adjust to condition or change in health will improve Outcome: Progressing   Problem: Education: Goal: Knowledge of General Education information will improve Description: Including pain rating scale, medication(s)/side effects and non-pharmacologic comfort measures Outcome: Progressing   Problem: Activity: Goal: Risk for activity intolerance will decrease Outcome: Progressing   Problem: Nutrition: Goal: Adequate nutrition will be maintained Outcome: Progressing   Problem: Elimination: Goal: Will not experience complications related to bowel motility Outcome: Progressing Goal: Will not experience complications related to urinary retention Outcome: Progressing   Problem: Pain Managment: Goal: General experience of comfort will improve and/or be controlled Outcome: Progressing   Problem: Safety: Goal: Ability to remain free from injury will improve Outcome: Progressing

## 2023-12-12 NOTE — Progress Notes (Signed)
 Pts blood sugar checked by NT Tarissa. NT notified me that she had to override the glucose machince because the patients armband would not scan. NT came and showed me the results of the blood sugar and it read 114. Pt does not receive insulin for a result of 113. MD Hayden Rasmussen made aware.

## 2023-12-12 NOTE — Plan of Care (Signed)

## 2023-12-12 NOTE — Progress Notes (Addendum)
 Virginia Rushing MD notified that a medication error had been made.  Instead of the patient receiving  0.5mg  of Ativan she received 1.5mg   Pts vitals checked every 15 mins x2 then every 30 mins x2. Pt is resting comfortably in the bed. PT alert and oriented and vital signs are stable.

## 2023-12-12 NOTE — Progress Notes (Signed)
 PROGRESS NOTE  Virginia Olson    DOB: [birth year], 48 y.o.  ZOX:[id number]    Code Status: Full Code   DOA: 12/10/2023   LOS: 0   Brief hospital course  Virginia Olson is a 48 y.o. female with medical history significant of menorrhagia, fibroids, status post laparoscopic hysterectomy, anemia, tremor, hypokalemia, obesity who presented to the emergency department with complaints of abdominal pain associated with nausea and multiple episodes of emesis after eating sushi.    Lab work: Urinalysis showed increased specific gravity, ketones greater than 80 mg/dL, trace protein and trace leukocyte esterase.  UDS was negative.  CBC showed white count 9.2, hemoglobin 11.5 g/dL platelets 811.  Lipase was 43.  Lactic acid was 2.5 and 1.5 mmol/L.  Normal hemoglobin A1c.   Imaging: CT abdomen and pelvis with contrast showed colonic diverticulosis with no diverticulitis.  Tiny hiatal hernia.  Hepatomegaly.   ED course: Initial vital signs were temperature 97.8 F, pulse 94, respiration 20, BP 98/61 mmHg O2 sat 100% on room air.  Patient received droperidol 2.5 mg IV, famotidine 20 mg IVP, fentanyl 50 mcg IVP, lorazepam 1 mg IVP, LR 1000 mL bolus, normal saline 2000 mm bolus, ondansetron 4 mg IVP prior to arriving to the hospital.  I added pantoprazole 40 mg IVP and magnesium sulfate 2 g IVPB.  Patient was admitted to medicine service for further workup and management of colitis as outlined in detail below.  12/12/23 -improved symptomatically  Assessment & Plan  Principal Problem:   Abdominal pain Active Problems:   Nausea with vomiting   Prolonged QT interval   Obesity, class 1   Asthma   Abdominal pain- improved.  - patient requests regular diet - antiemetics PRN - analgesics PRN     Borderline prolonged QT interval- improved Potassium level optimized. Magnesium sulfate 2 g IVPB given.     Obesity, class 1 Current BMI still pending. Would benefit from lifestyle modifications. Follow-up  closely with PCP and/or bariatric clinic.     Asthma Bronchodilators as needed.  Anxiety- patient endorses anxiety - one time ativan given and PRN hydroxyzine ordered  There is no height or weight on file to calculate BMI.  VTE ppx: SCDs Start: 12/11/23 0810   Diet:     Diet   Diet Heart Room service appropriate? Yes; Fluid consistency: Thin   Consultants: None   Subjective 12/12/23    Pt reports feeling much improved today. Has not eaten today yet. Abdominal pain improved. Not currently nauseous    Objective   Vitals:   12/11/23 1137 12/11/23 1414 12/11/23 2155 12/12/23 0536  BP: (!) 155/89 111/65 (!) 97/55 112/72  Pulse: 88 82 84 70  Resp: 17 16 20    Temp: 99.1 F (37.3 C) 98.3 F (36.8 C) 99.2 F (37.3 C) 98.4 F (36.9 C)  TempSrc: Oral Oral Oral Oral  SpO2: 99% 100% 97%     Intake/Output Summary (Last 24 hours) at 12/12/2023 9147 Last data filed at 12/11/2023 1700 Gross per 24 hour  Intake 739.08 ml  Output 1400 ml  Net -660.92 ml   There were no vitals filed for this visit.   Physical Exam: General: awake, alert, NAD HEENT: atraumatic, clear conjunctiva, anicteric sclera, MMM, hearing grossly normal Respiratory: normal respiratory effort. Cardiovascular: quick capillary refill, normal S1/S2, RRR, no JVD, murmurs Gastrointestinal: soft, NT, ND Nervous: A&O x3. no gross focal neurologic deficits, normal speech Extremities: moves all equally, no edema, normal tone Skin: dry, intact, normal temperature, normal color. No  rashes, lesions or ulcers on exposed skin Psychiatry: normal mood, congruent affect  Labs   I have personally reviewed the following labs and imaging studies CBC    Component Value Date/Time   WBC 5.7 12/12/2023 0556   RBC 3.58 (L) 12/12/2023 0556   HGB 10.4 (L) 12/12/2023 0556   HCT 32.1 (L) 12/12/2023 0556   PLT 284 12/12/2023 0556   MCV 89.7 12/12/2023 0556   MCH 29.1 12/12/2023 0556   MCHC 32.4 12/12/2023 0556   RDW 14.7  12/12/2023 0556   LYMPHSABS 2.4 12/10/2023 2253   MONOABS 0.6 12/10/2023 2253   EOSABS 0.0 12/10/2023 2253   BASOSABS 0.1 12/10/2023 2253      Latest Ref Rng & Units 12/12/2023    5:56 AM 12/10/2023   10:53 PM 04/06/2022    6:25 AM  BMP  Glucose 70 - 99 mg/dL 161  096  045   BUN 6 - 20 mg/dL 6  13  17    Creatinine 0.44 - 1.00 mg/dL 4.09  8.11  9.14   Sodium 135 - 145 mmol/L 137  134  138   Potassium 3.5 - 5.1 mmol/L 4.2  3.2  4.0   Chloride 98 - 111 mmol/L 109  100  102   CO2 22 - 32 mmol/L 25  21  22    Calcium 8.9 - 10.3 mg/dL 8.9  9.9  78.2     CT ABDOMEN PELVIS W CONTRAST Result Date: 12/11/2023 CLINICAL DATA:  Diverticulitis, complication suspected EXAM: CT ABDOMEN AND PELVIS WITH CONTRAST TECHNIQUE: Multidetector CT imaging of the abdomen and pelvis was performed using the standard protocol following bolus administration of intravenous contrast. RADIATION DOSE REDUCTION: This exam was performed according to the departmental dose-optimization program which includes automated exposure control, adjustment of the mA and/or kV according to patient size and/or use of iterative reconstruction technique. CONTRAST:  OMNIPAQUE IOHEXOL 300 MG/ML  SOLN COMPARISON:  CT abdomen pelvis 04/06/2022 FINDINGS: Lower chest: No acute abnormality.  Tiny hiatal hernia. Hepatobiliary: The liver is enlarged measuring up to 19 cm. No focal liver abnormality. No gallstones, gallbladder wall thickening, or pericholecystic fluid. No biliary dilatation. Pancreas: No focal lesion. Normal pancreatic contour. No surrounding inflammatory changes. No main pancreatic ductal dilatation. Spleen: Normal in size without focal abnormality. Adrenals/Urinary Tract: No adrenal nodule bilaterally. Bilateral kidneys enhance symmetrically. Bilateral simple parapelvic cysts. Simple renal cysts, in the absence of clinically indicated signs/symptoms, require no independent follow-up. No hydronephrosis. No hydroureter.  No  nephroureterolithiasis. The urinary bladder is unremarkable. Stomach/Bowel: Stomach is within normal limits. No evidence of bowel wall thickening or dilatation. Colonic diverticulosis. Appendix appears normal. Vascular/Lymphatic: No abdominal aorta or iliac aneurysm. No abdominal, pelvic, or inguinal lymphadenopathy. Reproductive: Status post hysterectomy. No adnexal masses. Other: No intraperitoneal free fluid. No intraperitoneal free gas. No organized fluid collection. Musculoskeletal: No abdominal wall hernia or abnormality. No suspicious lytic or blastic osseous lesions. No acute displaced fracture. Multilevel degenerative changes of the spine. IMPRESSION: 1. Colonic diverticulosis with no acute diverticulitis. 2. Tiny hiatal hernia. 3. Hepatomegaly. Electronically Signed   By: Tish Frederickson M.D.   On: 12/11/2023 01:58    Disposition Plan & Communication  Patient status: Observation  Admitted From: Home Planned disposition location: Home Anticipated discharge date: 3/12 pending clinical improvement   Family Communication: husband at bedside    Author: Leeroy Bock, DO [facility] 12/12/2023, 7:09 AM   Available by Epic secure chat 7AM-7PM. If 7PM-7AM, please contact night-coverage.  [facility] contact information  found on [email or web address].

## 2023-12-13 DIAGNOSIS — R1084 Generalized abdominal pain: Secondary | ICD-10-CM | POA: Diagnosis not present

## 2023-12-13 DIAGNOSIS — A09 Infectious gastroenteritis and colitis, unspecified: Secondary | ICD-10-CM | POA: Diagnosis not present

## 2023-12-13 LAB — GLUCOSE, CAPILLARY
Glucose-Capillary: 98 mg/dL (ref 70–99)
Glucose-Capillary: 99 mg/dL (ref 70–99)

## 2023-12-13 MED ORDER — SIMETHICONE 80 MG PO CHEW: 80.0000 mg | CHEWABLE_TABLET | Freq: Four times a day (QID) | ORAL | Status: DC | PRN

## 2023-12-13 MED ORDER — LACTULOSE 10 GM/15ML PO SOLN: 20.0000 g | Freq: Two times a day (BID) | ORAL | Status: DC | PRN

## 2023-12-13 MED ORDER — GLYCERIN (LAXATIVE) 2 G RE SUPP
1.0000 | Freq: Once | RECTAL | Status: AC
  Administered 2023-12-13: 1 via RECTAL
  Filled 2023-12-13: qty 1

## 2023-12-13 MED ORDER — METHOCARBAMOL 500 MG PO TABS
750.0000 mg | ORAL_TABLET | Freq: Four times a day (QID) | ORAL | Status: DC | PRN
  Administered 2023-12-13 – 2023-12-14 (×2): 750 mg via ORAL
  Filled 2023-12-13 (×2): qty 2

## 2023-12-13 MED ORDER — HYDROMORPHONE HCL 1 MG/ML IJ SOLN
0.5000 mg | Freq: Once | INTRAMUSCULAR | Status: AC | PRN
  Administered 2023-12-13: 0.5 mg via INTRAVENOUS
  Filled 2023-12-13: qty 0.5

## 2023-12-13 MED ORDER — SMOG ENEMA: 960.0000 mL | Freq: Once | RECTAL | Status: DC | PRN

## 2023-12-13 NOTE — Plan of Care (Signed)
  Problem: Elimination: Goal: Will not experience complications related to bowel motility Outcome: Progressing Goal: Will not experience complications related to urinary retention Outcome: Progressing   Problem: Safety: Goal: Ability to remain free from injury will improve Outcome: Progressing   Problem: Skin Integrity: Goal: Risk for impaired skin integrity will decrease Outcome: Progressing   

## 2023-12-13 NOTE — Progress Notes (Signed)
 PROGRESS NOTE  Virginia Olson    DOB: 03/12/1976, 48 y.o.  ZOX:[id number]    Code Status: Full Code   DOA: 12/10/2023   LOS: 0   Brief hospital course  Virginia Olson is a 48 y.o. female with medical history significant of menorrhagia, fibroids, status post laparoscopic hysterectomy, anemia, tremor, hypokalemia, obesity who presented to the emergency department with complaints of abdominal pain associated with nausea and multiple episodes of emesis after eating sushi.    Lab work: Urinalysis showed increased specific gravity, ketones greater than 80 mg/dL, trace protein and trace leukocyte esterase.  UDS was negative.  CBC showed white count 9.2, hemoglobin 11.5 g/dL platelets 811.  Lipase was 43.  Lactic acid was 2.5 and 1.5 mmol/L.  Normal hemoglobin A1c.   Imaging: CT abdomen and pelvis with contrast showed colonic diverticulosis with no diverticulitis.  Tiny hiatal hernia.  Hepatomegaly.   ED course: Initial vital signs were temperature 97.8 F, pulse 94, respiration 20, BP 98/61 mmHg O2 sat 100% on room air.  Patient received droperidol 2.5 mg IV, famotidine 20 mg IVP, fentanyl 50 mcg IVP, lorazepam 1 mg IVP, LR 1000 mL bolus, normal saline 2000 mm bolus, ondansetron 4 mg IVP prior to arriving to the hospital.  I added pantoprazole 40 mg IVP and magnesium sulfate 2 g IVPB.  Patient was admitted to medicine service for further workup and management of colitis as outlined in detail below.  12/13/23 -improved symptomatically  Assessment & Plan  Principal Problem:   Abdominal pain Active Problems:   Nausea with vomiting   Prolonged QT interval   Obesity, class 1   Asthma   Abdominal pain, nausea- improved. Suspect related to foodborne infection vs intolerance. She also endorses significant constipation - patient requests regular diet - antiemetics PRN - analgesics PRN - bowel regimen increased including suppository, enema     Borderline prolonged QT interval- improved Potassium  level optimized. Magnesium sulfate 2 g IVPB given.     Obesity, class 1 Current BMI still pending. Would benefit from lifestyle modifications. Follow-up closely with PCP and/or bariatric clinic.     Asthma Bronchodilators as needed.  Anxiety- patient endorses anxiety - one time ativan given and PRN hydroxyzine ordered  There is no height or weight on file to calculate BMI.  VTE ppx: SCDs Start: 12/11/23 0810   Diet:     Diet   Diet regular Room service appropriate? Yes; Fluid consistency: Thin   Consultants: None   Subjective 12/13/23    Pt reports feeling improved this morning but had significant nausea and vomiting overnight. Has not had BM since admission and endorses feeling uncomfortable.   Objective   Vitals:   12/12/23 1706 12/12/23 1735 12/12/23 1925 12/13/23 0312  BP: 139/75 (!) 145/67  124/72  Pulse: 80 79  75  Resp: 14 15    Temp: 100.3 F (37.9 C) 99.7 F (37.6 C)  98.7 F (37.1 C)  TempSrc: Oral Oral  Axillary  SpO2: 100% 96% 97% 94%    Intake/Output Summary (Last 24 hours) at 12/13/2023 0749 Last data filed at 12/12/2023 1600 Gross per 24 hour  Intake --  Output 450 ml  Net -450 ml   There were no vitals filed for this visit.   Physical Exam: General: awake, alert, NAD HEENT: atraumatic, clear conjunctiva, anicteric sclera, MMM, hearing grossly normal Respiratory: normal respiratory effort. Cardiovascular: quick capillary refill, normal S1/S2, RRR, no JVD, murmurs Gastrointestinal: soft, NT, ND Nervous: A&O x3. no gross focal neurologic  deficits, normal speech Extremities: moves all equally, no edema, normal tone Skin: dry, intact, normal temperature, normal color. No rashes, lesions or ulcers on exposed skin Psychiatry: normal mood, congruent affect  Labs   I have personally reviewed the following labs and imaging studies CBC    Component Value Date/Time   WBC 5.7 12/12/2023 0556   RBC 3.58 (L) 12/12/2023 0556   HGB 10.4 (L)  12/12/2023 0556   HCT 32.1 (L) 12/12/2023 0556   PLT 284 12/12/2023 0556   MCV 89.7 12/12/2023 0556   MCH 29.1 12/12/2023 0556   MCHC 32.4 12/12/2023 0556   RDW 14.7 12/12/2023 0556   LYMPHSABS 2.4 12/10/2023 2253   MONOABS 0.6 12/10/2023 2253   EOSABS 0.0 12/10/2023 2253   BASOSABS 0.1 12/10/2023 2253      Latest Ref Rng & Units 12/12/2023    5:56 AM 12/10/2023   10:53 PM 04/06/2022    6:25 AM  BMP  Glucose 70 - 99 mg/dL 409  811  914   BUN 6 - 20 mg/dL 6  13  17    Creatinine 0.44 - 1.00 mg/dL 7.82  9.56  2.13   Sodium 135 - 145 mmol/L 137  134  138   Potassium 3.5 - 5.1 mmol/L 4.2  3.2  4.0   Chloride 98 - 111 mmol/L 109  100  102   CO2 22 - 32 mmol/L 25  21  22    Calcium 8.9 - 10.3 mg/dL 8.9  9.9  08.6     No results found.   Disposition Plan & Communication  Patient status: Observation  Admitted From: Home Planned disposition location: Home Anticipated discharge date: 3/13 pending clinical improvement   Family Communication: husband at bedside    Author: Leeroy Bock, DO [facility] 12/13/2023, 7:49 AM   Available by Epic secure chat 7AM-7PM. If 7PM-7AM, please contact night-coverage.  [facility] contact information found on [email or web address].

## 2023-12-14 LAB — BASIC METABOLIC PANEL
Anion gap: 12 (ref 5–15)
BUN: 10 mg/dL (ref 6–20)
CO2: 24 mmol/L (ref 22–32)
Calcium: 9.9 mg/dL (ref 8.9–10.3)
Chloride: 98 mmol/L (ref 98–111)
Creatinine, Ser: 0.96 mg/dL (ref 0.44–1.00)
GFR, Estimated: >60 mL/min (ref >60)
Glucose, Bld: 103 mg/dL — ABNORMAL HIGH (ref 70–99)
Potassium: 3.8 mmol/L (ref 3.5–5.1)
Sodium: 134 mmol/L — ABNORMAL LOW (ref 135–145)

## 2023-12-14 MED ORDER — LACTULOSE 10 GM/15ML PO SOLN: 20.0000 g | Freq: Two times a day (BID) | ORAL | Status: DC

## 2023-12-14 NOTE — Discharge Summary (Signed)
 Physician Discharge Summary  Patient: Virginia Olson QMV:[id number] DOB: May 22, 1976   Code Status: Prior Admit date: 12/10/2023 Discharge date: 12/14/2023 Disposition: left AMA PCP: [facility], [facility]  Recommendations for Outpatient Follow-up:  Follow up with PCP within 1-2 weeks Regarding general hospital follow up and preventative care  Discharge Diagnoses:  Principal Problem:   Abdominal pain Active Problems:   Nausea with vomiting   Prolonged QT interval   Obesity, class 1   Asthma  Brief Hospital Course Summary: Virginia Olson is a 48 y.o. female with medical history significant of menorrhagia, fibroids, status post laparoscopic hysterectomy, anemia, tremor, hypokalemia, obesity who presented to the emergency department with complaints of abdominal pain associated with nausea and multiple episodes of emesis after eating sushi.    Lab work: Urinalysis showed increased specific gravity, ketones greater than 80 mg/dL, trace protein and trace leukocyte esterase.  UDS was negative.  CBC showed white count 9.2, hemoglobin 11.5 g/dL platelets 284.  Lipase was 43.  Lactic acid was 2.5 and 1.5 mmol/L.  Normal hemoglobin A1c. Imaging: CT abdomen and pelvis with contrast showed colonic diverticulosis with no diverticulitis.  Tiny hiatal hernia.  Hepatomegaly.   ED course: Initial vital signs were temperature 97.8 F, pulse 94, respiration 20, BP 98/61 mmHg O2 sat 100% on room air.  Patient received droperidol 2.5 mg IV, famotidine 20 mg IVP, fentanyl 50 mcg IVP, lorazepam 1 mg IVP, LR 1000 mL bolus, normal saline 2000 mm bolus, ondansetron 4 mg IVP prior to arriving to the hospital.  I added pantoprazole 40 mg IVP and magnesium sulfate 2 g IVPB.   Blood cultures negative to date.   Patient was treated symptomatically for suspected viral vs bacterial gastroenteritis with improvement in her symptoms. Treatment was ongoing when her husband took her from the hospital without  being further evaluated. He told nursing staff that they were going to go to [facility] or [place].   Did not see patient on day of dc but her labs were normalized and vitals were stable WNL.   All other chronic conditions were treated with home medications.    Discharge Condition: stable Recommended discharge diet: Regular healthy diet  Consultations: None   Procedures/Studies: None   Allergies as of 12/14/2023       Reactions   Amoxicillin Other (See Comments)   Yeast infection   Doxycycline Rash        Medication List     ASK your doctor about these medications    acetaminophen 325 MG tablet Commonly known as: TYLENOL Take 325-650 mg by mouth every 6 (six) hours as needed (for pain or headaches).   albuterol 108 (90 Base) MCG/ACT inhaler Commonly known as: VENTOLIN HFA Inhale 2 puffs into the lungs every 6 (six) hours as needed for wheezing or shortness of breath.   amoxicillin-clavulanate 875-125 MG tablet Commonly known as: AUGMENTIN Take 1 tablet by mouth every 12 (twelve) hours.   Bayer Aspirin 325 MG tablet Generic drug: aspirin EC Take 325 mg by mouth 2 (two) times daily as needed (for headaches).   budesonide-formoterol 80-4.5 MCG/ACT inhaler Commonly known as: SYMBICORT Inhale 2 puffs into the lungs 2 (two) times daily as needed for shortness of breath.   FIBER PO Take 1 tablet by mouth daily as needed (to increase fiber intake).   Ibuprofen 200 MG Caps Take 400 mg by mouth every 6 (six) hours as needed (for pain or headaches).   lubiprostone 8 MCG capsule Commonly known as:  AMITIZA Take 8 mcg by mouth 2 (two) times daily as needed for constipation (take with a meal).   metFORMIN 500 MG tablet Commonly known as: GLUCOPHAGE Take 500 mg by mouth 2 (two) times daily with a meal.   metroNIDAZOLE 500 MG tablet Commonly known as: Flagyl Take 1 tablet (500 mg total) by mouth 2 (two) times daily. One po bid x 7 days   ondansetron 4 MG disintegrating  tablet Commonly known as: ZOFRAN-ODT Take 1 tablet (4 mg total) by mouth every 4 (four) hours as needed for nausea or vomiting.   oxyCODONE 5 MG immediate release tablet Commonly known as: Oxy IR/ROXICODONE Take 1-2 tablets (5-10 mg total) by mouth every 6 (six) hours as needed for moderate pain or severe pain.   oxyCODONE-acetaminophen 5-325 MG tablet Commonly known as: Percocet Take 1-2 tablets by mouth every 6 (six) hours as needed.   PROBIOTIC PO Take 2 tablets by mouth See admin instructions. Chew 2 gummies by mouth once a day   spironolactone 100 MG tablet Commonly known as: ALDACTONE Take 100 mg by mouth 2 (two) times daily.   topiramate 25 MG tablet Commonly known as: TOPAMAX Take 1 tablet (25 mg total) by mouth daily.   Wegovy 2.4 MG/0.75ML Soaj Generic drug: Semaglutide-Weight Management Inject 2.4 mg into the skin every Monday.         Subjective   Pt not seen day of dc due to leaving prior to rounds. Situation described further in nursing documentation.   All questions and concerns were addressed at time of discharge.  Objective  Blood pressure (!) 148/93, pulse 76, temperature 98.4 F (36.9 C), temperature source Oral, resp. rate 18, height 5\' 9"  (1.753 m), weight 95.6 kg, last menstrual period 03/16/2015, SpO2 97%.   The results of significant diagnostics from this hospitalization (including imaging, microbiology, ancillary and laboratory) are listed below for reference.   Imaging studies: CT ABDOMEN PELVIS W CONTRAST Result Date: 12/11/2023 CLINICAL DATA:  Diverticulitis, complication suspected EXAM: CT ABDOMEN AND PELVIS WITH CONTRAST TECHNIQUE: Multidetector CT imaging of the abdomen and pelvis was performed using the standard protocol following bolus administration of intravenous contrast. RADIATION DOSE REDUCTION: This exam was performed according to the departmental dose-optimization program which includes automated exposure control, adjustment of the  mA and/or kV according to patient size and/or use of iterative reconstruction technique. CONTRAST:  OMNIPAQUE IOHEXOL 300 MG/ML  SOLN COMPARISON:  CT abdomen pelvis 04/06/2022 FINDINGS: Lower chest: No acute abnormality.  Tiny hiatal hernia. Hepatobiliary: The liver is enlarged measuring up to 19 cm. No focal liver abnormality. No gallstones, gallbladder wall thickening, or pericholecystic fluid. No biliary dilatation. Pancreas: No focal lesion. Normal pancreatic contour. No surrounding inflammatory changes. No main pancreatic ductal dilatation. Spleen: Normal in size without focal abnormality. Adrenals/Urinary Tract: No adrenal nodule bilaterally. Bilateral kidneys enhance symmetrically. Bilateral simple parapelvic cysts. Simple renal cysts, in the absence of clinically indicated signs/symptoms, require no independent follow-up. No hydronephrosis. No hydroureter.  No nephroureterolithiasis. The urinary bladder is unremarkable. Stomach/Bowel: Stomach is within normal limits. No evidence of bowel wall thickening or dilatation. Colonic diverticulosis. Appendix appears normal. Vascular/Lymphatic: No abdominal aorta or iliac aneurysm. No abdominal, pelvic, or inguinal lymphadenopathy. Reproductive: Status post hysterectomy. No adnexal masses. Other: No intraperitoneal free fluid. No intraperitoneal free gas. No organized fluid collection. Musculoskeletal: No abdominal wall hernia or abnormality. No suspicious lytic or blastic osseous lesions. No acute displaced fracture. Multilevel degenerative changes of the spine. IMPRESSION: 1. Colonic diverticulosis with no  acute diverticulitis. 2. Tiny hiatal hernia. 3. Hepatomegaly. Electronically Signed   By: Tish Frederickson M.D.   On: 12/11/2023 01:58    Labs: Basic Metabolic Panel: Recent Labs  Lab 12/10/23 2253 12/12/23 0556 12/14/23 0720  NA 134* 137 134*  K 3.2* 4.2 3.8  CL 100 109 98  CO2 21* 25 24  GLUCOSE 164* 101* 103*  BUN 13 6 10   CREATININE 1.06*  0.80 0.96  CALCIUM 9.9 8.9 9.9  MG 1.7  --   --    CBC: Recent Labs  Lab 12/10/23 2253 12/12/23 0556  WBC 9.2 5.7  NEUTROABS 6.2  --   HGB 11.5* 10.4*  HCT 33.4* 32.1*  MCV 84.6 89.7  PLT 340 284   Microbiology: Results for orders placed or performed during the hospital encounter of 12/10/23  Culture, blood (Routine X 2) w Reflex to ID Panel     Status: None (Preliminary result)   Collection Time: 12/12/23  7:34 PM   Specimen: BLOOD LEFT ARM  Result Value Ref Range Status   Specimen Description   Final    BLOOD LEFT ARM Performed at [facility], [address], [place], [place] [address]    Special Requests   Final    BOTTLES DRAWN AEROBIC ONLY Blood Culture results may not be optimal due to an inadequate volume of blood received in culture bottles Performed at [facility], [address], [place], [place] [address]    Culture   Final    NO GROWTH 2 DAYS Performed at [facility], [address] [place], [place] [address]    Report Status PENDING  Incomplete    Time coordinating discharge: Over 30 minutes  Leeroy Bock, MD  [facility] 12/14/2023, 9:07 PM

## 2023-12-14 NOTE — Progress Notes (Signed)
 Patient and husband had expressed that they were wanting to be discharged this am. I notified the MD that they were wanting to leave. Per husband he is planning to take her to a different facility. I expressed to patient and husband that the MD is rounding. Patient's husband stated they were willing to wait 15 more minutes prior to leaving. This RN explained the process of what happens when a patient leaves against medical advice.Patient refused to sign form but was willing to stay a little longer to wait for MD. Patient, husband, and daughter proceeded to elevator with patient in reclining chair and IV still intact. Family left rolling recliner chair on unit after staff addressed with family. Husband pushed the charge nurse at elevator. Security called and found patient downstairs with family. Patient allowed this RN to remove IV.

## 2023-12-17 LAB — CULTURE, BLOOD (ROUTINE X 2)
Culture: NO GROWTH
Culture: NO GROWTH

## 2024-09-23 ENCOUNTER — Other Ambulatory Visit: Payer: Self-pay | Admitting: Family Medicine

## 2024-09-23 DIAGNOSIS — R251 Tremor, unspecified: Secondary | ICD-10-CM

## 2024-09-23 DIAGNOSIS — R42 Dizziness and giddiness: Secondary | ICD-10-CM

## 2024-10-02 ENCOUNTER — Encounter: Payer: Self-pay | Admitting: Family Medicine

## 2024-10-04 ENCOUNTER — Ambulatory Visit
Admission: RE | Admit: 2024-10-04 | Discharge: 2024-10-04 | Disposition: A | Discharge status: Home / Self Care | Payer: PRIVATE HEALTH INSURANCE | Source: Ambulatory Visit | Attending: Family Medicine | Admitting: Family Medicine

## 2024-10-04 DIAGNOSIS — R251 Tremor, unspecified: Secondary | ICD-10-CM

## 2024-10-04 DIAGNOSIS — R42 Dizziness and giddiness: Secondary | ICD-10-CM
